# Patient Record
Sex: Female | Born: 1945
Health system: Southern US, Community
[De-identification: ages and names within clinical notes are randomized; demographics above are authoritative.]

## PROBLEM LIST (undated history)

## (undated) DIAGNOSIS — D649 Anemia, unspecified: Secondary | ICD-10-CM

## (undated) DIAGNOSIS — K2970 Gastritis, unspecified, without bleeding: Secondary | ICD-10-CM

## (undated) DIAGNOSIS — N879 Dysplasia of cervix uteri, unspecified: Secondary | ICD-10-CM

## (undated) DIAGNOSIS — K219 Gastro-esophageal reflux disease without esophagitis: Secondary | ICD-10-CM

## (undated) DIAGNOSIS — T7840XA Allergy, unspecified, initial encounter: Secondary | ICD-10-CM

## (undated) DIAGNOSIS — M8580 Other specified disorders of bone density and structure, unspecified site: Secondary | ICD-10-CM

## (undated) HISTORY — DX: Other specified disorders of bone density and structure, unspecified site: M85.80

## (undated) HISTORY — PX: OTHER SURGICAL HISTORY: SHX169

## (undated) HISTORY — PX: NASAL SINUS SURGERY: SHX719

## (undated) HISTORY — PX: GYNECOLOGIC CRYOSURGERY: SHX857

## (undated) HISTORY — DX: Anemia, unspecified: D64.9

## (undated) HISTORY — PX: EYE SURGERY: SHX253

## (undated) HISTORY — DX: Dysplasia of cervix uteri, unspecified: N87.9

## (undated) HISTORY — DX: Allergy, unspecified, initial encounter: T78.40XA

## (undated) HISTORY — DX: Gastro-esophageal reflux disease without esophagitis: K21.9

## (undated) HISTORY — PX: TONSILLECTOMY: SUR1361

## (undated) HISTORY — PX: COLPOSCOPY: SHX161

## (undated) HISTORY — DX: Gastritis, unspecified, without bleeding: K29.70

---

## 1968-12-17 DIAGNOSIS — N879 Dysplasia of cervix uteri, unspecified: Secondary | ICD-10-CM

## 1968-12-17 HISTORY — DX: Dysplasia of cervix uteri, unspecified: N87.9

## 1999-04-15 ENCOUNTER — Encounter: Admission: RE | Admit: 1999-04-15 | Discharge: 1999-04-15 | Payer: Self-pay | Admitting: Gynecology

## 1999-04-15 ENCOUNTER — Encounter: Payer: Self-pay | Admitting: Gynecology

## 1999-05-11 ENCOUNTER — Other Ambulatory Visit: Admission: RE | Admit: 1999-05-11 | Discharge: 1999-05-11 | Payer: Self-pay | Admitting: Gynecology

## 2000-04-17 ENCOUNTER — Encounter: Admission: RE | Admit: 2000-04-17 | Discharge: 2000-04-17 | Payer: Self-pay | Admitting: Gynecology

## 2000-04-17 ENCOUNTER — Encounter: Payer: Self-pay | Admitting: Gynecology

## 2000-04-27 ENCOUNTER — Other Ambulatory Visit: Admission: RE | Admit: 2000-04-27 | Discharge: 2000-04-27 | Payer: Self-pay | Admitting: Gynecology

## 2001-04-19 ENCOUNTER — Encounter: Admission: RE | Admit: 2001-04-19 | Discharge: 2001-04-19 | Payer: Self-pay | Admitting: Gynecology

## 2001-04-19 ENCOUNTER — Encounter: Payer: Self-pay | Admitting: Gynecology

## 2001-04-30 ENCOUNTER — Other Ambulatory Visit: Admission: RE | Admit: 2001-04-30 | Discharge: 2001-04-30 | Payer: Self-pay | Admitting: Gynecology

## 2003-02-21 ENCOUNTER — Encounter: Admission: RE | Admit: 2003-02-21 | Discharge: 2003-02-21 | Payer: Self-pay | Admitting: Obstetrics and Gynecology

## 2003-03-17 ENCOUNTER — Other Ambulatory Visit: Admission: RE | Admit: 2003-03-17 | Discharge: 2003-03-17 | Payer: Self-pay | Admitting: Obstetrics and Gynecology

## 2004-04-18 HISTORY — PX: UPPER GASTROINTESTINAL ENDOSCOPY: SHX188

## 2004-04-29 ENCOUNTER — Other Ambulatory Visit: Admission: RE | Admit: 2004-04-29 | Discharge: 2004-04-29 | Payer: Self-pay | Admitting: Obstetrics and Gynecology

## 2005-05-31 ENCOUNTER — Other Ambulatory Visit: Admission: RE | Admit: 2005-05-31 | Discharge: 2005-05-31 | Payer: Self-pay | Admitting: Obstetrics and Gynecology

## 2006-08-28 ENCOUNTER — Encounter: Admission: RE | Admit: 2006-08-28 | Discharge: 2006-08-28 | Payer: Self-pay | Admitting: Obstetrics and Gynecology

## 2006-09-14 ENCOUNTER — Other Ambulatory Visit: Admission: RE | Admit: 2006-09-14 | Discharge: 2006-09-14 | Payer: Self-pay | Admitting: Obstetrics and Gynecology

## 2007-09-05 ENCOUNTER — Encounter: Admission: RE | Admit: 2007-09-05 | Discharge: 2007-09-05 | Payer: Self-pay | Admitting: Obstetrics and Gynecology

## 2007-09-18 ENCOUNTER — Other Ambulatory Visit: Admission: RE | Admit: 2007-09-18 | Discharge: 2007-09-18 | Payer: Self-pay | Admitting: Obstetrics and Gynecology

## 2008-09-05 ENCOUNTER — Encounter: Admission: RE | Admit: 2008-09-05 | Discharge: 2008-09-05 | Payer: Self-pay | Admitting: Obstetrics and Gynecology

## 2008-09-09 ENCOUNTER — Encounter: Admission: RE | Admit: 2008-09-09 | Discharge: 2008-09-09 | Payer: Self-pay | Admitting: Obstetrics and Gynecology

## 2008-09-23 ENCOUNTER — Ambulatory Visit: Payer: Self-pay | Admitting: Obstetrics and Gynecology

## 2008-09-25 ENCOUNTER — Other Ambulatory Visit: Admission: RE | Admit: 2008-09-25 | Discharge: 2008-09-25 | Payer: Self-pay | Admitting: Obstetrics and Gynecology

## 2008-09-25 ENCOUNTER — Ambulatory Visit: Payer: Self-pay | Admitting: Obstetrics and Gynecology

## 2008-09-25 ENCOUNTER — Encounter: Payer: Self-pay | Admitting: Obstetrics and Gynecology

## 2009-05-21 ENCOUNTER — Ambulatory Visit: Payer: Self-pay | Admitting: Obstetrics and Gynecology

## 2009-09-17 ENCOUNTER — Encounter: Admission: RE | Admit: 2009-09-17 | Discharge: 2009-09-17 | Payer: Self-pay | Admitting: Obstetrics and Gynecology

## 2009-10-01 ENCOUNTER — Ambulatory Visit: Payer: Self-pay | Admitting: Obstetrics and Gynecology

## 2010-01-25 ENCOUNTER — Ambulatory Visit (HOSPITAL_COMMUNITY): Admission: RE | Admit: 2010-01-25 | Discharge: 2010-01-25 | Payer: Self-pay | Admitting: Family Medicine

## 2010-01-27 ENCOUNTER — Ambulatory Visit (HOSPITAL_COMMUNITY): Admission: RE | Admit: 2010-01-27 | Discharge: 2010-01-27 | Payer: Self-pay | Admitting: Obstetrics and Gynecology

## 2010-01-27 ENCOUNTER — Ambulatory Visit: Payer: Self-pay | Admitting: Obstetrics and Gynecology

## 2010-02-09 ENCOUNTER — Ambulatory Visit: Payer: Self-pay | Admitting: Obstetrics and Gynecology

## 2010-02-25 ENCOUNTER — Ambulatory Visit: Payer: Self-pay | Admitting: Obstetrics and Gynecology

## 2010-03-18 HISTORY — PX: DILATION AND CURETTAGE OF UTERUS: SHX78

## 2010-03-26 ENCOUNTER — Ambulatory Visit: Payer: Self-pay | Admitting: Obstetrics and Gynecology

## 2010-03-26 ENCOUNTER — Ambulatory Visit
Admission: RE | Admit: 2010-03-26 | Discharge: 2010-03-26 | Payer: Self-pay | Source: Home / Self Care | Attending: Obstetrics and Gynecology | Admitting: Obstetrics and Gynecology

## 2010-04-01 ENCOUNTER — Ambulatory Visit: Payer: Self-pay | Admitting: Obstetrics and Gynecology

## 2010-06-29 LAB — POCT HEMOGLOBIN-HEMACUE: Hemoglobin: 13.1 g/dL (ref 12.0–15.0)

## 2010-09-23 ENCOUNTER — Other Ambulatory Visit: Payer: Self-pay | Admitting: Obstetrics and Gynecology

## 2010-09-23 DIAGNOSIS — Z1231 Encounter for screening mammogram for malignant neoplasm of breast: Secondary | ICD-10-CM

## 2010-09-30 ENCOUNTER — Encounter (INDEPENDENT_AMBULATORY_CARE_PROVIDER_SITE_OTHER): Payer: Medicare Other

## 2010-09-30 ENCOUNTER — Ambulatory Visit
Admission: RE | Admit: 2010-09-30 | Discharge: 2010-09-30 | Disposition: A | Discharge status: Home / Self Care | Payer: Medicare Other | Source: Ambulatory Visit | Attending: Obstetrics and Gynecology | Admitting: Obstetrics and Gynecology

## 2010-09-30 DIAGNOSIS — M810 Age-related osteoporosis without current pathological fracture: Secondary | ICD-10-CM

## 2010-09-30 DIAGNOSIS — Z1231 Encounter for screening mammogram for malignant neoplasm of breast: Secondary | ICD-10-CM

## 2010-10-07 ENCOUNTER — Ambulatory Visit: Payer: Self-pay

## 2010-10-19 ENCOUNTER — Ambulatory Visit
Admission: RE | Admit: 2010-10-19 | Discharge: 2010-10-19 | Disposition: A | Discharge status: Home / Self Care | Payer: Medicare Other | Source: Ambulatory Visit | Attending: Family Medicine | Admitting: Family Medicine

## 2010-10-19 ENCOUNTER — Other Ambulatory Visit: Payer: Self-pay | Admitting: Family Medicine

## 2010-10-19 DIAGNOSIS — R197 Diarrhea, unspecified: Secondary | ICD-10-CM

## 2010-10-19 DIAGNOSIS — K921 Melena: Secondary | ICD-10-CM

## 2010-10-19 MED ORDER — IOHEXOL 300 MG/ML  SOLN
100.0000 mL | Freq: Once | INTRAMUSCULAR | Status: AC | PRN
  Administered 2010-10-19: 100 mL via INTRAVENOUS

## 2010-10-21 ENCOUNTER — Other Ambulatory Visit (HOSPITAL_COMMUNITY)
Admission: RE | Admit: 2010-10-21 | Discharge: 2010-10-21 | Disposition: A | Discharge status: Home / Self Care | Payer: Medicare Other | Source: Ambulatory Visit | Attending: Obstetrics and Gynecology | Admitting: Obstetrics and Gynecology

## 2010-10-21 ENCOUNTER — Other Ambulatory Visit: Payer: Self-pay | Admitting: Obstetrics and Gynecology

## 2010-10-21 ENCOUNTER — Encounter (INDEPENDENT_AMBULATORY_CARE_PROVIDER_SITE_OTHER): Payer: Medicare Other | Admitting: Obstetrics and Gynecology

## 2010-10-21 DIAGNOSIS — Z124 Encounter for screening for malignant neoplasm of cervix: Secondary | ICD-10-CM | POA: Insufficient documentation

## 2010-10-21 DIAGNOSIS — K625 Hemorrhage of anus and rectum: Secondary | ICD-10-CM

## 2010-10-21 DIAGNOSIS — R82998 Other abnormal findings in urine: Secondary | ICD-10-CM

## 2010-10-21 DIAGNOSIS — N951 Menopausal and female climacteric states: Secondary | ICD-10-CM

## 2010-10-21 DIAGNOSIS — M949 Disorder of cartilage, unspecified: Secondary | ICD-10-CM

## 2010-10-21 DIAGNOSIS — N952 Postmenopausal atrophic vaginitis: Secondary | ICD-10-CM

## 2010-11-17 ENCOUNTER — Other Ambulatory Visit: Payer: Self-pay | Admitting: *Deleted

## 2010-11-17 MED ORDER — NONFORMULARY OR COMPOUNDED ITEM: Status: DC

## 2011-01-05 ENCOUNTER — Encounter: Payer: Self-pay | Admitting: Internal Medicine

## 2011-02-03 ENCOUNTER — Encounter: Payer: Self-pay | Admitting: Internal Medicine

## 2011-02-08 ENCOUNTER — Ambulatory Visit (INDEPENDENT_AMBULATORY_CARE_PROVIDER_SITE_OTHER): Payer: Medicare Other | Admitting: Internal Medicine

## 2011-02-08 ENCOUNTER — Encounter: Payer: Self-pay | Admitting: Internal Medicine

## 2011-02-08 ENCOUNTER — Other Ambulatory Visit (INDEPENDENT_AMBULATORY_CARE_PROVIDER_SITE_OTHER): Payer: Medicare Other

## 2011-02-08 VITALS — BP 118/74 | HR 64 | Ht 65.0 in | Wt 147.0 lb

## 2011-02-08 DIAGNOSIS — K625 Hemorrhage of anus and rectum: Secondary | ICD-10-CM

## 2011-02-08 DIAGNOSIS — R143 Flatulence: Secondary | ICD-10-CM

## 2011-02-08 DIAGNOSIS — M8580 Other specified disorders of bone density and structure, unspecified site: Secondary | ICD-10-CM | POA: Insufficient documentation

## 2011-02-08 DIAGNOSIS — M899 Disorder of bone, unspecified: Secondary | ICD-10-CM

## 2011-02-08 DIAGNOSIS — R140 Abdominal distension (gaseous): Secondary | ICD-10-CM

## 2011-02-08 DIAGNOSIS — R142 Eructation: Secondary | ICD-10-CM

## 2011-02-08 MED ORDER — RANITIDINE HCL 300 MG PO TABS: 300.0000 mg | ORAL_TABLET | Freq: Every day | ORAL | Status: DC

## 2011-02-08 MED ORDER — PEG-KCL-NACL-NASULF-NA ASC-C 100 G PO SOLR: 1.0000 | Freq: Once | ORAL | Status: DC

## 2011-02-08 MED ORDER — ALIGN PO CAPS: 1.0000 | ORAL_CAPSULE | Freq: Every day | ORAL | Status: AC

## 2011-02-08 NOTE — Progress Notes (Signed)
Subjective:    Patient ID: Natalie Browning, female    DOB: 20-Feb-1946, 65 y.o.   MRN: [id number]  HPI Natalie Browning is a 65 yo female with little PMH who is seen in consultation at the request of Dr. Tanya Nones for evaluation of bloating and epigastric pain associated with eating.  The patient reports today she feels very well and is without specific complaints. She reports when she last saw her PCP she was having intermittent epigastric discomfort but this has completely resolved. She does report intermittent bloating and belching. This seems to be worse with foods such as bread. She also reports belching which seems to be worse at night. She reports 3-4 times per week waking up with a feeling of a "bubble" in her stomach which is relieved by belching. She often will chew TUMS at this point in an attempt to avoid the need for further belching. She denies specific pain associated with this sensation. She also denies nausea and vomiting. No dysphagia or odynophagia. Her appetite is good and her weight is stable without weight loss. She's had no fevers or chills. She was given a prescription for omeprazole but discontinued this after developing a rash. She is also concerned about calcium absorption with PPI therapy given her history of osteopenia. She denies heartburn as a major complaint, though she will very infrequently have heartburn. She reports watching her diet, specifically eating late at night. The dietary modifications control her symptoms. At present she reports her stools are normal, occurring once per day. Her stools have been formed, without bright red blood or melena.  She does report an isolated episode of bright red blood per rectum which occurred in July 2012. She subsequently has not seen blood in her stools. She reports an episode in early July a feeling nauseous and weak. This was followed by the feeling of being "paralyzed".  At that time she had associated shortness of breath. She reports this  slowly resolved over course of a few hours, but the next day is when she noticed the bright red rectal bleeding. There've been no further neurologic symptoms or nausea since this event.  Of note she feels she had a colonoscopy and upper endoscopy about 6 years ago. She is unsure who did this procedure, but feels it might have been Dr. Kinnie Scales. She does not recall any specific findings/problems found during these exams.   Review of Systems Constitutional: Negative for fever, chills, night sweats, activity change, appetite change and unexpected weight change HEENT: Negative for sore throat, mouth sores and trouble swallowing. Eyes: Negative for visual disturbance Respiratory: Negative for cough, chest tightness and shortness of breath Cardiovascular: Negative for chest pain, palpitations and lower extremity swelling Gastrointestinal: See history of present illness Genitourinary: Negative for dysuria and hematuria. Musculoskeletal: Negative for back pain, arthralgias and myalgias Skin: Negative for rash or color change Neurological: Negative for headaches, weakness, numbness Hematological: Negative for adenopathy, negative for easy bruising/bleeding Psychiatric/behavioral: Negative for depressed mood, negative for anxiety  PMH: 1. Osteopenia 2. Uterine polyps - s/p D&C 3. Tonsillectomy  Current Outpatient Prescriptions  Medication Sig Dispense Refill  . NONFORMULARY/COMPOUNDED ITEM Triest 1.25 mg gel daily        Allergies  Allergen Reactions  . Codeine   . Omeprazole    Family History  Problem Relation Age of Onset  . Diabetes Mother   . Heart disease Mother   . Heart disease Father   --Neg for GI tract malignancy  Social History  .  Marital Status: Married   Social History Main Topics  . Smoking status: Never Smoker   . Smokeless tobacco: Never Used  . Alcohol Use: No  . 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 No distress. HEENT: Normocephalic and atraumatic. Oropharynx is clear and moist. No oropharyngeal exudate. Conjunctivae are normal. Pupils are equal round and reactive to light. No scleral icterus. Neck: Neck supple. Trachea midline. Cardiovascular: Normal rate, regular rhythm and intact distal pulses. No M/R/G Pulmonary/chest: Effort normal and breath sounds normal. No wheezing, rales or rhonchi. Abdominal: Soft, nontender, nondistended. Bowel sounds active throughout. There are no masses palpable. No hepatosplenomegaly. Extremities: no clubbing, cyanosis, or edema Lymphadenopathy: No cervical adenopathy noted. Neurological: Alert and oriented to person place and time. Skin: Skin is warm and dry. No rashes noted. Psychiatric: Normal mood and affect. Behavior is normal.  Labs 12/29/2010: Sodium 138 potassium 4.7 chloride 101 CO2 24 BUN 15 creatinine 0.89 glucose 101 Total bili 1.0 alkaline phosphatase 66 AST 22 ALT 11 Total protein 7.0 albumin 4.1 calcium 9.5 H. pylori antibody, IgG negative WBC 7.4 hemoglobin 13.0 hematocrit 40.1 platelet 231  Imaging 10/19/2010 CT abdomen and pelvis with contrast There are 2 tiny hepatic cysts, facet degenerative changes are present at the lower lumbar spine. Otherwise unremarkable.    Assessment & Plan:  65 yo female with PMH of osteopenia who presents with intermittent bloating/belching with an episode of hematochezia in July 2012.  1. Bloating/belching -- the patient is reporting intermittent bloating and belching. She is not currently having epigastric or abdominal pain. At present there are no alarm symptoms, including no bleeding, nausea, vomiting, weight loss, early satiety. This is reassuring. At present I do not think upper endoscopy is warranted, but we will try H2 blocker therapy  each bedtime. Hopefully this will help with some of her nocturnal symptoms, and given that she will only take this at bedtime it should not interfere significantly with calcium absorption. I also will prescribe Align one tablet daily, which also may help with bloating/belching and improve overall digestion. I will check a celiac panel today. The remainder her her labs/imaging have been reviewed and are reassuring. If her symptoms continue, don't respond to therapy, or worsen then we will proceed with upper endoscopy. I will attempt to get records of her previous endoscopy.   2. Isolated rectal bleeding -- the patient did have an episode of hematochezia in July 2012. This has not recurred, and certainly could have been hemorrhoidal in nature. However given the uncertainty here, we will proceed with colonoscopy. She is agreeable to this test he'll be scheduled for her today.  Return in 3 months or sooner if needed.

## 2011-02-08 NOTE — Patient Instructions (Addendum)
Please go downstairs after your appointment to have labs drawn. You have been scheduled for a colonoscopy, instructions provided. The prescription for the prep has been sent to your pharmacy. You may take Ranitidine, 300mg m by mouth every night You may take Align, 1 tablet every day.  Please follow up in 3 months

## 2011-02-10 ENCOUNTER — Telehealth: Payer: Self-pay | Admitting: *Deleted

## 2011-02-10 NOTE — Telephone Encounter (Signed)
Informed pt her Celiac Panel was negative and we will see her on 03/01/11 at 3:30pm for her procedure; pt stated understanding.

## 2011-02-10 NOTE — Telephone Encounter (Signed)
Message copied by Florene Glen on Thu Feb 10, 2011 10:24 AM ------      Message from: Beverley Fiedler      Created: Thu Feb 10, 2011  8:31 AM       Celiac panel negative.

## 2011-03-01 ENCOUNTER — Encounter: Payer: Self-pay | Admitting: Internal Medicine

## 2011-03-01 ENCOUNTER — Ambulatory Visit (AMBULATORY_SURGERY_CENTER): Payer: Medicare Other | Admitting: Internal Medicine

## 2011-03-01 VITALS — BP 115/78 | HR 57 | Temp 98.3°F | Resp 15 | Ht 65.0 in | Wt 147.0 lb

## 2011-03-01 DIAGNOSIS — K625 Hemorrhage of anus and rectum: Secondary | ICD-10-CM

## 2011-03-01 DIAGNOSIS — Z1211 Encounter for screening for malignant neoplasm of colon: Secondary | ICD-10-CM

## 2011-03-01 MED ORDER — SODIUM CHLORIDE 0.9 % IV SOLN: 500.0000 mL | INTRAVENOUS | Status: DC

## 2011-03-01 NOTE — Patient Instructions (Signed)
Mild diverticulosis (pouches not diverticulitis). Internal hemorrhoids otherwise normal. High fiber diet but not today.  See blue and green discharge instruction sheets.

## 2011-03-02 ENCOUNTER — Telehealth: Payer: Self-pay | Admitting: *Deleted

## 2011-03-02 NOTE — Telephone Encounter (Signed)

## 2011-09-14 ENCOUNTER — Other Ambulatory Visit: Payer: Self-pay | Admitting: Obstetrics and Gynecology

## 2011-09-14 DIAGNOSIS — Z1231 Encounter for screening mammogram for malignant neoplasm of breast: Secondary | ICD-10-CM

## 2011-10-05 ENCOUNTER — Ambulatory Visit
Admission: RE | Admit: 2011-10-05 | Discharge: 2011-10-05 | Disposition: A | Discharge status: Home / Self Care | Payer: Medicare Other | Source: Ambulatory Visit | Attending: Obstetrics and Gynecology | Admitting: Obstetrics and Gynecology

## 2011-10-05 DIAGNOSIS — Z1231 Encounter for screening mammogram for malignant neoplasm of breast: Secondary | ICD-10-CM

## 2011-10-13 ENCOUNTER — Encounter: Payer: Self-pay | Admitting: Gynecology

## 2011-10-24 ENCOUNTER — Encounter: Payer: Self-pay | Admitting: Obstetrics and Gynecology

## 2011-10-24 ENCOUNTER — Ambulatory Visit (INDEPENDENT_AMBULATORY_CARE_PROVIDER_SITE_OTHER): Payer: Medicare Other | Admitting: Obstetrics and Gynecology

## 2011-10-24 VITALS — BP 120/78 | Ht 65.0 in | Wt 153.0 lb

## 2011-10-24 DIAGNOSIS — Z780 Asymptomatic menopausal state: Secondary | ICD-10-CM

## 2011-10-24 DIAGNOSIS — M8580 Other specified disorders of bone density and structure, unspecified site: Secondary | ICD-10-CM

## 2011-10-24 DIAGNOSIS — M949 Disorder of cartilage, unspecified: Secondary | ICD-10-CM | POA: Diagnosis not present

## 2011-10-24 DIAGNOSIS — M899 Disorder of bone, unspecified: Secondary | ICD-10-CM | POA: Diagnosis not present

## 2011-10-24 DIAGNOSIS — D069 Carcinoma in situ of cervix, unspecified: Secondary | ICD-10-CM | POA: Diagnosis not present

## 2011-10-24 DIAGNOSIS — H269 Unspecified cataract: Secondary | ICD-10-CM | POA: Insufficient documentation

## 2011-10-24 NOTE — Progress Notes (Signed)
The patient came to see me today for further followup. She has been on hormone replacement therapy for greater than 10 years. She had preferred bioidentical hormones and has done well on them. She had terrible sleep disturbance due to hot flashes and they have worked well. We switched her a year ago from oral triest to cream to reduce thromboembolic risk and she is just as happy. She has no withdrawal bleeding to cyclical progesterone. She has had no vaginal bleeding at all. She is having no pelvic pain. She is up-to-date on mammograms. She is osteopenia on bone density without an elevated FRAX risk. Her last bone density was 2012 and showed improvement. She is also not having atrophic vaginitis symptoms with the estrogen. She does get some joint pain in the morning but it is minimal. She has a history of CIN treated with cryosurgery back in the 1970s. She has had normal Paps since then and had a normal Pap last year.  ROS: 12 system review done. Pertinent positives above. Only other positive is cataracts.  Physical examination: Kennon Portela present. HEENT within normal limits. Neck: Thyroid not large. No masses. Supraclavicular nodes: not enlarged. Breasts: Examined in both sitting and lying  position. No skin changes and no masses. Abdomen: Soft no guarding rebound or masses or hernia. Pelvic: External: Within normal limits. BUS: Within normal limits. Vaginal:within normal limits. Good estrogen effect. No evidence of cystocele rectocele or enterocele. Cervix: clean. Uterus: Normal size and shape. Adnexa: No masses. Rectovaginal exam: Confirmatory and negative. Extremities: Within normal limits.  Assessment: #1. Menopausal symptoms #2. CIN #3. Stable osteopenia  Plan: We discussed HRT. Discussed a higher risk of breast cancer. Discussed problems related to bioidentical hormones. Patient would like to continue hormones and use  bioidentical in spite of the above. Continue Triest cream 1.25mg  daily.  Continue Prometrium 200 mg for 12 days a month. Patient may take a break in the fall to see if she needs them. Continue yearly mammograms. Continue periodic bone densities.The new Pap smear guidelines were discussed with the patient. No pap done.

## 2011-10-28 ENCOUNTER — Encounter: Payer: Self-pay | Admitting: Obstetrics and Gynecology

## 2011-11-07 DIAGNOSIS — J019 Acute sinusitis, unspecified: Secondary | ICD-10-CM | POA: Diagnosis not present

## 2011-11-07 DIAGNOSIS — J45909 Unspecified asthma, uncomplicated: Secondary | ICD-10-CM | POA: Diagnosis not present

## 2011-11-18 DIAGNOSIS — R05 Cough: Secondary | ICD-10-CM | POA: Diagnosis not present

## 2011-11-18 DIAGNOSIS — J301 Allergic rhinitis due to pollen: Secondary | ICD-10-CM | POA: Diagnosis not present

## 2012-02-08 DIAGNOSIS — Z23 Encounter for immunization: Secondary | ICD-10-CM | POA: Diagnosis not present

## 2012-02-27 DIAGNOSIS — H2510 Age-related nuclear cataract, unspecified eye: Secondary | ICD-10-CM | POA: Diagnosis not present

## 2012-02-27 DIAGNOSIS — IMO0002 Reserved for concepts with insufficient information to code with codable children: Secondary | ICD-10-CM | POA: Diagnosis not present

## 2012-03-07 DIAGNOSIS — H2510 Age-related nuclear cataract, unspecified eye: Secondary | ICD-10-CM | POA: Diagnosis not present

## 2012-03-28 DIAGNOSIS — H2510 Age-related nuclear cataract, unspecified eye: Secondary | ICD-10-CM | POA: Diagnosis not present

## 2012-04-04 DIAGNOSIS — H2510 Age-related nuclear cataract, unspecified eye: Secondary | ICD-10-CM | POA: Diagnosis not present

## 2012-08-09 DIAGNOSIS — Z961 Presence of intraocular lens: Secondary | ICD-10-CM | POA: Diagnosis not present

## 2012-09-05 ENCOUNTER — Other Ambulatory Visit: Payer: Self-pay

## 2012-09-05 DIAGNOSIS — Z1231 Encounter for screening mammogram for malignant neoplasm of breast: Secondary | ICD-10-CM

## 2012-10-15 ENCOUNTER — Ambulatory Visit
Admission: RE | Admit: 2012-10-15 | Discharge: 2012-10-15 | Disposition: A | Discharge status: Home / Self Care | Payer: Medicare Other | Source: Ambulatory Visit

## 2012-10-15 DIAGNOSIS — Z1231 Encounter for screening mammogram for malignant neoplasm of breast: Secondary | ICD-10-CM

## 2012-10-29 ENCOUNTER — Encounter: Payer: Self-pay | Admitting: Gynecology

## 2012-10-29 ENCOUNTER — Ambulatory Visit (INDEPENDENT_AMBULATORY_CARE_PROVIDER_SITE_OTHER): Payer: Medicare Other | Admitting: Gynecology

## 2012-10-29 VITALS — BP 120/70 | Ht 65.0 in | Wt 152.0 lb

## 2012-10-29 DIAGNOSIS — N952 Postmenopausal atrophic vaginitis: Secondary | ICD-10-CM

## 2012-10-29 DIAGNOSIS — Z79890 Hormone replacement therapy (postmenopausal): Secondary | ICD-10-CM

## 2012-10-29 DIAGNOSIS — M899 Disorder of bone, unspecified: Secondary | ICD-10-CM | POA: Diagnosis not present

## 2012-10-29 DIAGNOSIS — N951 Menopausal and female climacteric states: Secondary | ICD-10-CM

## 2012-10-29 DIAGNOSIS — M8580 Other specified disorders of bone density and structure, unspecified site: Secondary | ICD-10-CM

## 2012-10-29 DIAGNOSIS — R82998 Other abnormal findings in urine: Secondary | ICD-10-CM | POA: Diagnosis not present

## 2012-10-29 MED ORDER — NONFORMULARY OR COMPOUNDED ITEM: Status: DC

## 2012-10-29 MED ORDER — PROGESTERONE MICRONIZED 200 MG PO CAPS: 200.0000 mg | ORAL_CAPSULE | ORAL | Status: DC

## 2012-10-29 NOTE — Patient Instructions (Signed)
Followup in one year, sooner as needed.  Report any vaginal bleeding. 

## 2012-10-29 NOTE — Progress Notes (Signed)
Natalie Browning [birth year] [id number]        67 y.o.  G0P0 for followup exam.  Former patient of Dr. Eda Paschal.  Past medical history,surgical history, medications, allergies, family history and social history were all reviewed and documented in the EPIC chart.  ROS:  Performed and pertinent positives and negatives are included in the history, assessment and plan .  Exam: Kim assistant Filed Vitals:   10/29/12 1016  BP: 120/70  Height: 5\' 5"  (1.651 m)  Weight: 152 lb (68.947 kg)   General appearance  Normal Skin grossly normal Head/Neck normal with no cervical or supraclavicular adenopathy thyroid normal Lungs  clear Cardiac RR, without RMG Abdominal  soft, nontender, without masses, organomegaly or hernia Breasts  examined lying and sitting without masses, retractions, discharge or axillary adenopathy. Pelvic  Ext/BUS/vagina  normal with atrophic changes  Cervix  normal with atrophic changes  Uterus  anteverted, normal size, shape and contour, midline and mobile nontender   Adnexa  Without masses or tenderness    Anus and perineum  normal   Rectovaginal  normal sphincter tone without palpated masses or tenderness.    Assessment/Plan:  67 y.o. G0P0 female for annual exam.   1. HRT. Patient is on Triest 1.25 mg (formulated estrogen cream) daily and Prometrium 200 mg first 12 days of each month with no withdrawal bleeding.  I reviewed the whole issue of HRT with her to include the WHI study with increased risk of stroke, heart attack, DVT and breast cancer. The ACOG and NAMS statements for lowest dose for the shortest period of time reviewed. Transdermal versus oral first-pass effect benefit discussed as well as bioidentical versus pharmaceutical grade issues and lack of prospective randomized studies as Dr. Eda Paschal has in the past. Patient has tried weaning with unacceptable hot flushes and night sweats. Currently not sexually active and not having vaginal symptoms. After likely  discussion she wants to continue and I refill her times a year. Alternatives to include switching to daily progesterone such as Prometrium 100 mg nightly discussed but patient would prefer to keep doing what she's doing. She knows to report any vaginal bleeding to me. 2. Osteopenia. Excess 09/2010 T score -2.1 FRAX 17%/1.9%. Stable from prior DEXA 2 years before. Options to repeat DEXA now versus waiting another year discussed. Patient would prefer to wait another year. Increase calcium vitamin D reviewed. 3. Pap smear 2012. No Pap smear done today. History of cryosurgery in the 1970s with normal Pap smears since then. Options to stop screening altogether she is over the age of 65 or less frequent screening intervals reviewed. Will rediscuss next year at a 3 year interval. 4. Mammography 09/2012. Continue with annual mammography. SBE monthly reviewed. 5. Colonoscopy 2012. Repeated their recommended interval. 6. Health maintenance. No blood work done. Recommended she followup with her primary for general health exam and blood work and she agrees to arrange.  Note: This document was prepared with digital dictation and possible smart phrase technology. Any transcriptional errors that result from this process are unintentional.   Dara Lords MD, 10:47 AM 10/29/2012

## 2012-10-30 LAB — URINALYSIS W MICROSCOPIC + REFLEX CULTURE
Bacteria, UA: NONE SEEN
Bilirubin Urine: NEGATIVE
Casts: NONE SEEN
Crystals: NONE SEEN
Glucose, UA: NEGATIVE mg/dL
Hgb urine dipstick: NEGATIVE
Ketones, ur: NEGATIVE mg/dL
Specific Gravity, Urine: 1.017 (ref 1.005–1.030)
pH: 7 (ref 5.0–8.0)

## 2012-11-01 ENCOUNTER — Other Ambulatory Visit: Payer: Self-pay | Admitting: Gynecology

## 2012-11-01 MED ORDER — NITROFURANTOIN MONOHYD MACRO 100 MG PO CAPS: 100.0000 mg | ORAL_CAPSULE | Freq: Two times a day (BID) | ORAL | Status: DC

## 2012-11-02 ENCOUNTER — Telehealth: Payer: Self-pay | Admitting: *Deleted

## 2012-11-02 MED ORDER — SULFAMETHOXAZOLE-TMP DS 800-160 MG PO TABS: 1.0000 | ORAL_TABLET | Freq: Two times a day (BID) | ORAL | Status: DC

## 2012-11-02 NOTE — Telephone Encounter (Signed)
We can try Septra DS 1 by mouth twice a day x3 days

## 2012-11-02 NOTE — Telephone Encounter (Signed)
Pt informed with the below note. rx sent 

## 2012-11-02 NOTE — Telephone Encounter (Signed)
Left message on voicemail.

## 2012-11-02 NOTE — Telephone Encounter (Signed)
Pt was given rx for Macrobid 100 mg x 7 days  Yesterday pt said she took medication c/o nausea, feels very sick. Please advise

## 2012-11-15 ENCOUNTER — Telehealth: Payer: Self-pay | Admitting: Family Medicine

## 2012-11-15 NOTE — Telephone Encounter (Signed)
Pt claims that [facility] had prescribe omeprazole to her in February 2013 I see no information where we had prescribed to her. Pt states her last ov was in feb 2013 as well.

## 2012-11-15 NOTE — Telephone Encounter (Signed)
If LOV was 05/2011, then she NTBS

## 2012-11-16 ENCOUNTER — Other Ambulatory Visit: Payer: Self-pay | Admitting: Physician Assistant

## 2012-11-16 MED ORDER — OMEPRAZOLE 20 MG PO CPDR: 20.0000 mg | DELAYED_RELEASE_CAPSULE | Freq: Every day | ORAL | Status: DC

## 2012-11-16 NOTE — Telephone Encounter (Signed)
Pt has not been seen in over a year.  Must make appt.

## 2012-11-16 NOTE — Telephone Encounter (Signed)
Per Dr. Tanya Nones ok to refill pt was seen here in 11/18/11 she does not need an appt. for this medication. Med refilled and .Patient aware

## 2012-11-20 ENCOUNTER — Ambulatory Visit: Payer: Medicare Other | Admitting: Family Medicine

## 2012-12-06 ENCOUNTER — Telehealth: Payer: Self-pay | Admitting: Family Medicine

## 2012-12-06 NOTE — Telephone Encounter (Signed)
Pt is taking Omeprazole 20 mg at lunch time. She said that it is not working. She is losing sleep because she is burping all night long. She said she was taking it a dinner time, but a friend told her to try taking it early so that is why she started taking it at lunch time. Is there anything else she can try?

## 2012-12-06 NOTE — Telephone Encounter (Signed)
Switch to pantoprazole 40 poday, elevate the head of bed 3- inchs with bricks under headboard.  NTBS if no better in 2 weeks.

## 2012-12-07 MED ORDER — PANTOPRAZOLE SODIUM 40 MG PO TBEC: 40.0000 mg | DELAYED_RELEASE_TABLET | Freq: Every day | ORAL | Status: DC

## 2012-12-07 NOTE — Addendum Note (Signed)
Addended by: Legrand Rams B on: 12/07/2012 02:50 PM   Modules accepted: Orders

## 2012-12-07 NOTE — Telephone Encounter (Signed)
LMTRC

## 2012-12-07 NOTE — Telephone Encounter (Signed)
Patient aware and med sent to pharmacy.  

## 2012-12-10 ENCOUNTER — Encounter: Payer: Self-pay | Admitting: Family Medicine

## 2012-12-10 ENCOUNTER — Ambulatory Visit (INDEPENDENT_AMBULATORY_CARE_PROVIDER_SITE_OTHER): Payer: Medicare Other | Admitting: Family Medicine

## 2012-12-10 VITALS — BP 116/70 | HR 52 | Temp 97.5°F | Resp 18 | Ht 65.0 in | Wt 152.0 lb

## 2012-12-10 DIAGNOSIS — G8929 Other chronic pain: Secondary | ICD-10-CM

## 2012-12-10 DIAGNOSIS — K3189 Other diseases of stomach and duodenum: Secondary | ICD-10-CM | POA: Diagnosis not present

## 2012-12-10 DIAGNOSIS — R1013 Epigastric pain: Secondary | ICD-10-CM

## 2012-12-10 LAB — CBC WITH DIFFERENTIAL/PLATELET
Eosinophils Absolute: 0.1 10*3/uL (ref 0.0–0.7)
Eosinophils Relative: 2 % (ref 0–5)
Hemoglobin: 13.6 g/dL (ref 12.0–15.0)
Lymphocytes Relative: 37 % (ref 12–46)
Lymphs Abs: 1.8 10*3/uL (ref 0.7–4.0)
MCH: 30 pg (ref 26.0–34.0)
MCV: 87.7 fL (ref 78.0–100.0)
Monocytes Relative: 6 % (ref 3–12)
Platelets: 237 10*3/uL (ref 150–400)
RBC: 4.54 MIL/uL (ref 3.87–5.11)
WBC: 4.8 10*3/uL (ref 4.0–10.5)

## 2012-12-10 LAB — COMPLETE METABOLIC PANEL WITH GFR
ALT: 18 U/L (ref 0–35)
BUN: 15 mg/dL (ref 6–23)
Calcium: 9.8 mg/dL (ref 8.4–10.5)
Creat: 0.98 mg/dL (ref 0.50–1.10)
GFR, Est African American: 68 mL/min
GFR, Est Non African American: 59 mL/min — ABNORMAL LOW
Potassium: 5.1 mEq/L (ref 3.5–5.3)
Sodium: 137 mEq/L (ref 135–145)

## 2012-12-10 NOTE — Progress Notes (Signed)
Subjective:    Patient ID: Natalie Browning, female    DOB: Jul 13, 1945, 67 y.o.   MRN: [id number]  HPI In July, the patient saw her gynecologist, and was diagnosed with a urinary tract infection. She was treated with Macrobid and then later a sulfa drug. She thereafter she has developed dyspepsia. For the entire month of August, the patient reports excessive belching. This occurs primarily at night. She's tried elevating the head of her bed as well as elevating herself on pillows without benefit. She burps constantly. He does improve with Beano.  She started omeprazole 40 mg by mouth daily but saw no benefit after 3 weeks. She denies any melena or hematochezia. She denies any excessive reflux during the day. She denies any signs or symptoms of bowel obstruction. She denies any fevers chills or weight loss. She denies abd pain. Past Medical History  Diagnosis Date  . Allergy   . Anemia   . Cataract   . Endometrial polyp   . Osteopenia 09/2010    T score -2.1 FRAX 17%/1.9%  . Cervical dysplasia 1970's   Past Surgical History  Procedure Laterality Date  . Tonsillectomy    . Dilation and curettage of uterus  03/18/2010  . Upper gastrointestinal endoscopy  2006  . Hysteroscopy    . Nasal sinus surgery    . Colposcopy    . Gynecologic cryosurgery    . Cataract surg     Current Outpatient Prescriptions on File Prior to Visit  Medication Sig Dispense Refill  . Calcium Carbonate-Vitamin D (CALCIUM + D PO) Take by mouth.      Marland Kitchen MAGNESIUM PO Take by mouth.      . Multiple Vitamin (MULTIVITAMIN) tablet Take 1 tablet by mouth daily.      . NONFORMULARY OR COMPOUNDED ITEM Triest 1.25 mg gel daily  1 each  12  . Omega-3 Fatty Acids (FISH OIL PO) Take by mouth.      . pantoprazole (PROTONIX) 40 MG tablet Take 1 tablet (40 mg total) by mouth daily.  30 tablet  11  . progesterone (PROMETRIUM) 200 MG capsule Take 1 capsule (200 mg total) by mouth as directed. Days 1-12  12 capsule  12  .  sulfamethoxazole-trimethoprim (BACTRIM DS) 800-160 MG per tablet Take 1 tablet by mouth 2 (two) times daily.  6 tablet  0   No current facility-administered medications on file prior to visit.   Allergies  Allergen Reactions  . Ciprofloxacin   . Codeine Itching and Nausea Only  . Macrobid [Nitrofurantoin] Nausea And Vomiting  . Simethicone Nausea Only   History   Social History  . Marital Status: Married    Spouse Name: N/A    Number of Children: N/A  . Years of Education: N/A   Occupational History  . Not on file.   Social History Main Topics  . Smoking status: Never Smoker   . Smokeless tobacco: Never Used  . Alcohol Use: No  . Drug Use: No  . Sexual Activity: No   Other Topics Concern  . Not on file   Social History Narrative  . No narrative on file     Review of Systems  All other systems reviewed and are negative.       Objective:   Physical Exam  Vitals reviewed. Constitutional: She appears well-developed and well-nourished. No distress.  Cardiovascular: Normal rate, regular rhythm, normal heart sounds and intact distal pulses.  Exam reveals no gallop and no friction rub.  No murmur heard. Pulmonary/Chest: Effort normal and breath sounds normal. No respiratory distress. She has no wheezes. She has no rales.  Abdominal: Soft. Bowel sounds are normal. She exhibits no distension. There is no tenderness. There is no rebound and no guarding.  Skin: She is not diaphoretic.          Assessment & Plan:  1. Abdominal pain, chronic, epigastric - CBC with Differential - COMPLETE METABOLIC PANEL WITH GFR - Helicobacter pylori abs-IgG+IgA, bld  2. Dyspepsia Discontinue omeprazole. Start pantoprazole 40 mg by mouth daily. Also recommended a probiotic one pill by mouth daily to see if this could be a type of bacterial imbalance triggered by the antibiotics as cause of excessive bloating and gas. Recheck in 2 weeks. Also check a CBC CMP and H. pylori studies.  If the labs are normal and the meds prove ineffective, consider an evaluation for a hiatal hernia.

## 2012-12-12 LAB — HELICOBACTER PYLORI ABS-IGG+IGA, BLD: HELICOBACTER PYLORI AB, IGA: 3.7 U/mL (ref <9.0)

## 2012-12-24 ENCOUNTER — Telehealth: Payer: Self-pay | Admitting: Family Medicine

## 2012-12-24 NOTE — Telephone Encounter (Signed)
Calling you back after two weeks of taking Protonix and Probiotic. Patients states is feeling much better.  Having less belching.  Wanted to let you know as instructed.

## 2012-12-25 ENCOUNTER — Telehealth: Payer: Self-pay | Admitting: Family Medicine

## 2012-12-25 NOTE — Telephone Encounter (Signed)
Pt returned call.  Stated is starting to have some diarrhea.  Told her this is probably due to Probiotic.  Dr Tanya Nones was going to tell her to stop that.  Continue Protonix.  Let us know if diarrhea does not resolve.

## 2012-12-25 NOTE — Telephone Encounter (Signed)
Natalie Browning wanted you to know that the anti-acid medication that you put her on has helped a lot with her burping.

## 2012-12-25 NOTE — Telephone Encounter (Signed)
Please continue protonix but stop probiotic and see if symptoms return.

## 2012-12-25 NOTE — Telephone Encounter (Signed)
Continue protonix and stop probiotic. I may have already sent you this.

## 2013-02-08 ENCOUNTER — Ambulatory Visit (INDEPENDENT_AMBULATORY_CARE_PROVIDER_SITE_OTHER): Payer: Medicare Other | Admitting: Family Medicine

## 2013-02-08 DIAGNOSIS — Z23 Encounter for immunization: Secondary | ICD-10-CM

## 2013-03-27 ENCOUNTER — Encounter: Payer: Self-pay | Admitting: Physician Assistant

## 2013-03-27 ENCOUNTER — Ambulatory Visit (INDEPENDENT_AMBULATORY_CARE_PROVIDER_SITE_OTHER): Payer: Medicare Other | Admitting: Physician Assistant

## 2013-03-27 VITALS — BP 128/88 | HR 72 | Temp 98.2°F | Resp 20 | Wt 154.0 lb

## 2013-03-27 DIAGNOSIS — A499 Bacterial infection, unspecified: Secondary | ICD-10-CM | POA: Diagnosis not present

## 2013-03-27 DIAGNOSIS — J988 Other specified respiratory disorders: Secondary | ICD-10-CM | POA: Diagnosis not present

## 2013-03-27 MED ORDER — AMOXICILLIN-POT CLAVULANATE 875-125 MG PO TABS: 1.0000 | ORAL_TABLET | Freq: Two times a day (BID) | ORAL | Status: DC

## 2013-03-27 NOTE — Progress Notes (Signed)
    Patient ID: Natalie Browning MRN: [id number], DOB: Jul 11, 1945, 67 y.o. Date of Encounter: 03/27/2013, 11:48 AM    Chief Complaint:  Chief Complaint  Patient presents with  . head cold x 1 week     HPI: 67 y.o. year old white female reports that she has been sick for over one week. Is getting no better. Has a lot of head and nasal congestion. Getting out thick dark mucus from her nose. Has some cough but that seems to be secondary to drainage down her throat. No chest congestion. Has had no fevers or chills. Has been using the Nettie pot twice a day. Also taking Mucinex and antihistamine. Taking NyQuil every night. Still unable to sleep secondary to head feeling stuffed up and having to breathe through her mouth.     Home Meds: See attached medication section for any medications that were entered at today's visit. The computer does not put those onto this list.The following list is a list of meds entered prior to today's visit.   Current Outpatient Prescriptions on File Prior to Visit  Medication Sig Dispense Refill  . Calcium Carbonate-Vitamin D (CALCIUM + D PO) Take by mouth.      Marland Kitchen MAGNESIUM PO Take by mouth.      . Omega-3 Fatty Acids (FISH OIL PO) Take by mouth.      . progesterone (PROMETRIUM) 200 MG capsule Take 1 capsule (200 mg total) by mouth as directed. Days 1-12  12 capsule  12  . Multiple Vitamin (MULTIVITAMIN) tablet Take 1 tablet by mouth daily.      . pantoprazole (PROTONIX) 40 MG tablet Take 1 tablet (40 mg total) by mouth daily.  30 tablet  11   No current facility-administered medications on file prior to visit.    Allergies:  Allergies  Allergen Reactions  . Ciprofloxacin   . Codeine Itching and Nausea Only  . Macrobid [Nitrofurantoin] Nausea And Vomiting  . Simethicone Nausea Only      Review of Systems: See HPI for pertinent ROS. All other ROS negative.    Physical Exam: Blood pressure 128/88, pulse 72, temperature 98.2 F (36.8 C), temperature  source Oral, resp. rate 20, weight 154 lb (69.854 kg)., Body mass index is 25.63 kg/(m^2). General: WNWD WF.  Appears in no acute distress. HEENT: Normocephalic, atraumatic, eyes without discharge, sclera non-icteric, nares are without discharge. Bilateral auditory canals clear, TM's are without perforation, pearly grey and translucent with reflective cone of light bilaterally. Oral cavity moist, posterior pharynx without exudate, erythema, peritonsillar abscess. No tenderness with percussion of the sinuses.  Neck: Supple. No thyromegaly. No lymphadenopathy. Lungs: Clear bilaterally to auscultation without wheezes, rales, or rhonchi. Breathing is unlabored. Heart: Regular rhythm. No murmurs, rubs, or gallops. Msk:  Strength and tone normal for age. Extremities/Skin: Warm and dry. No clubbing or cyanosis. No edema. No rashes or suspicious lesions. Neuro: Alert and oriented X 3. Moves all extremities spontaneously. Gait is normal. CNII-XII grossly in tact. Psych:  Responds to questions appropriately with a normal affect.     ASSESSMENT AND PLAN:  67 y.o. year old female with  1. Bacterial respiratory infection - amoxicillin-clavulanate (AUGMENTIN) 875-125 MG per tablet; Take 1 tablet by mouth 2 (two) times daily.  Dispense: 20 tablet; Refill: 0 Continue medications for symptomatic management in the meantime. Followup if symptoms do not resolve after completion of antibiotic.  Signed, [address] [place], [place], [facility] 03/27/2013 11:48 AM

## 2013-05-03 ENCOUNTER — Telehealth: Payer: Self-pay | Admitting: *Deleted

## 2013-05-03 NOTE — Telephone Encounter (Signed)
PA FORM FOR PROGESTERONE 200 MG DAY 1-12 OF EVERY MONTH FAXED TO [employer], WILL WAIT FOR RESPONSE.

## 2013-05-06 NOTE — Telephone Encounter (Signed)
[employer] approved the below rx effective 05/03/13-05/03/14.

## 2013-08-06 ENCOUNTER — Ambulatory Visit (INDEPENDENT_AMBULATORY_CARE_PROVIDER_SITE_OTHER): Payer: Medicare Other | Admitting: Family Medicine

## 2013-08-06 ENCOUNTER — Encounter: Payer: Self-pay | Admitting: Family Medicine

## 2013-08-06 VITALS — BP 130/80 | HR 58 | Temp 97.5°F | Resp 14 | Ht 65.5 in | Wt 153.0 lb

## 2013-08-06 DIAGNOSIS — M722 Plantar fascial fibromatosis: Secondary | ICD-10-CM

## 2013-08-06 NOTE — Progress Notes (Signed)
Subjective:    Patient ID: Natalie Browning, female    DOB: 07-26-1945, 68 y.o.   MRN: [id number]  HPI Patient has had one year of nagging pain in her left heel. It is located along the medial portion of the calcaneus near the insertion of the plantar fascia. It is worse after prolonged standing. He is also worse when she first gets out of bed in the morning or after rising from a seated position. Is pain in the heel. It does not radiate anywhere. She does not have any pain in the ankle patient not having pain in the Achilles tendon. She denies any pain in the forefoot or her toes.  She is tried over-the-counter orthotics, she has tried several stretches she is blind. She has not tried any anti-inflammatories because she is scared about side effects. She would like a referral to a podiatrist or an orthopedist. Past Medical History  Diagnosis Date  . Allergy   . Anemia   . Cataract   . Endometrial polyp   . Osteopenia 09/2010    T score -2.1 FRAX 17%/1.9%  . Cervical dysplasia 1970's   Current Outpatient Prescriptions on File Prior to Visit  Medication Sig Dispense Refill  . Calcium Carbonate-Vitamin D (CALCIUM + D PO) Take by mouth.      Marland Kitchen MAGNESIUM PO Take by mouth.      . Multiple Vitamin (MULTIVITAMIN) tablet Take 1 tablet by mouth daily.      . NONFORMULARY OR COMPOUNDED ITEM Place vaginally daily. Compounded estrogen cream      . Omega-3 Fatty Acids (FISH OIL PO) Take by mouth.      . pantoprazole (PROTONIX) 40 MG tablet Take 1 tablet (40 mg total) by mouth daily.  30 tablet  11  . progesterone (PROMETRIUM) 200 MG capsule Take 1 capsule (200 mg total) by mouth as directed. Days 1-12  12 capsule  12   No current facility-administered medications on file prior to visit.   Allergies  Allergen Reactions  . Ciprofloxacin   . Codeine Itching and Nausea Only  . Macrobid [Nitrofurantoin] Nausea And Vomiting  . Simethicone Nausea Only   History   Social History  . Marital Status:  Married    Spouse Name: N/A    Number of Children: N/A  . Years of Education: N/A   Occupational History  . Not on file.   Social History Main Topics  . Smoking status: Never Smoker   . Smokeless tobacco: Never Used  . Alcohol Use: No  . Drug Use: No  . Sexual Activity: No   Other Topics Concern  . Not on file   Social History Narrative  . No narrative on file      Review of Systems  All other systems reviewed and are negative.      Objective:   Physical Exam  Vitals reviewed. Cardiovascular: Normal rate and regular rhythm.   Pulmonary/Chest: Effort normal and breath sounds normal.  Musculoskeletal:       Left ankle: Normal.       Left foot: She exhibits tenderness and bony tenderness. She exhibits normal range of motion, no swelling, normal capillary refill, no crepitus, no deformity and no laceration.          Assessment & Plan:  1. Plantar fasciitis, left I offered the patient a trial of NSAIDs versus a cortisone injection. She deferred both of these for now. She would  like to see a podiatrist which I will gladly  arrange for this patient.   - Ambulatory referral to Podiatry

## 2013-08-12 DIAGNOSIS — Z961 Presence of intraocular lens: Secondary | ICD-10-CM | POA: Diagnosis not present

## 2013-08-12 DIAGNOSIS — H04129 Dry eye syndrome of unspecified lacrimal gland: Secondary | ICD-10-CM | POA: Diagnosis not present

## 2013-08-13 ENCOUNTER — Ambulatory Visit (INDEPENDENT_AMBULATORY_CARE_PROVIDER_SITE_OTHER): Payer: Medicare Other | Admitting: Podiatry

## 2013-08-13 ENCOUNTER — Encounter: Payer: Self-pay | Admitting: Podiatry

## 2013-08-13 VITALS — BP 152/79 | HR 55 | Ht 65.5 in | Wt 148.0 lb

## 2013-08-13 DIAGNOSIS — M21969 Unspecified acquired deformity of unspecified lower leg: Secondary | ICD-10-CM | POA: Diagnosis not present

## 2013-08-13 DIAGNOSIS — M79609 Pain in unspecified limb: Secondary | ICD-10-CM | POA: Diagnosis not present

## 2013-08-13 DIAGNOSIS — M722 Plantar fascial fibromatosis: Secondary | ICD-10-CM

## 2013-08-13 NOTE — Progress Notes (Signed)
Subjective: Not a bad pain. Some times it can be. Duration of one year and 4 month. Started December 2013.  Depending on what she was doing. Usually after been working out in yard or on feet. Does water aerobics and swimming for exercise.  Has not had any injection or treatment. Uses iced bottled water and rolls in the evening, also stretch exercise.   Review of Systems - General ROS: negative for - chills, fatigue, fever, night sweats, weight gain or weight loss Ophthalmic ROS: Has dry eyes. Lens implanted Dec 2013.  ENT ROS: negative Allergy and Immunology ROS: negative Breast ROS: negative for breast lumps Respiratory ROS: no cough, shortness of breath, or wheezing Cardiovascular ROS: no chest pain or dyspnea on exertion Gastrointestinal ROS: Has acid reflux and controlled with medication.  Genito-Urinary ROS: no dysuria, trouble voiding, or hematuria Musculoskeletal ROS: negative Neurological ROS: no TIA or stroke symptoms Dermatological ROS: negative.  Objective: Dermatologic: No open lesions. Vascular: All pedal pulses palpable. No edema or erythema noted.  Orthopedic: High arched cavus type foot with excess sagittal plane motion of the first ray L>R. Neurologic: All epicritic and tactile sensations grossly intact.  Radiographic examination reveal rectus foot, short first metatarsal in AP view and elevated first ray in Lateral view on both feet. STJ CYMA line is normal. No increase in lateral deviation angle of Calcaneocuboid angle. No other significant abnormal findings.   Assessment: Plantar fasciitis left. Hypermobile first ray L>R.  Plan: Reviewed findings and available treatment options, such as injection, NSAIA, exercise, change in shoe gear and activities, and custom orthotics. Patient requested injection today. Left heel injection given with mixture of 4 mg Dexamethasone and 5 mg Triamcinolone, and 1 cc of 0.5% Marcaine plain.  Patient tolerated well. Return in 2  weeks.

## 2013-08-13 NOTE — Patient Instructions (Signed)
Seen for pain in left heel.  Injection to the left heel given. Return in 2 weeks.  May benefit from Orthotic shoe inserts.

## 2013-08-27 ENCOUNTER — Ambulatory Visit (INDEPENDENT_AMBULATORY_CARE_PROVIDER_SITE_OTHER): Payer: Medicare Other | Admitting: Podiatry

## 2013-08-27 ENCOUNTER — Encounter: Payer: Self-pay | Admitting: Podiatry

## 2013-08-27 VITALS — BP 136/81 | HR 57

## 2013-08-27 DIAGNOSIS — M722 Plantar fascial fibromatosis: Secondary | ICD-10-CM

## 2013-08-27 NOTE — Patient Instructions (Signed)
Follow up on left heel pain. Injection helped. Proper shoe gear discussed. Will prepare Orthotics if heel pain flares back up. Continue with moderate exercise.

## 2013-08-27 NOTE — Progress Notes (Signed)
Follow up on left heel pain. Injection helped.  Discussed proper shoe gear. Will wait on Orthotics till next flare up. Return as needed.

## 2013-09-03 ENCOUNTER — Other Ambulatory Visit: Payer: Self-pay

## 2013-09-03 DIAGNOSIS — Z1231 Encounter for screening mammogram for malignant neoplasm of breast: Secondary | ICD-10-CM

## 2013-09-10 DIAGNOSIS — H04129 Dry eye syndrome of unspecified lacrimal gland: Secondary | ICD-10-CM | POA: Diagnosis not present

## 2013-09-16 DIAGNOSIS — M8580 Other specified disorders of bone density and structure, unspecified site: Secondary | ICD-10-CM

## 2013-09-16 HISTORY — DX: Other specified disorders of bone density and structure, unspecified site: M85.80

## 2013-09-17 ENCOUNTER — Other Ambulatory Visit: Payer: Self-pay | Admitting: Gynecology

## 2013-09-17 DIAGNOSIS — Z780 Asymptomatic menopausal state: Secondary | ICD-10-CM

## 2013-09-26 ENCOUNTER — Ambulatory Visit (INDEPENDENT_AMBULATORY_CARE_PROVIDER_SITE_OTHER): Payer: Medicare Other

## 2013-09-26 ENCOUNTER — Other Ambulatory Visit: Payer: Self-pay | Admitting: Gynecology

## 2013-09-26 DIAGNOSIS — M899 Disorder of bone, unspecified: Secondary | ICD-10-CM | POA: Diagnosis not present

## 2013-09-26 DIAGNOSIS — M949 Disorder of cartilage, unspecified: Secondary | ICD-10-CM

## 2013-09-26 DIAGNOSIS — Z780 Asymptomatic menopausal state: Secondary | ICD-10-CM

## 2013-09-26 DIAGNOSIS — M8580 Other specified disorders of bone density and structure, unspecified site: Secondary | ICD-10-CM

## 2013-09-27 ENCOUNTER — Encounter: Payer: Self-pay | Admitting: Gynecology

## 2013-10-08 DIAGNOSIS — H04129 Dry eye syndrome of unspecified lacrimal gland: Secondary | ICD-10-CM | POA: Diagnosis not present

## 2013-10-16 ENCOUNTER — Encounter (INDEPENDENT_AMBULATORY_CARE_PROVIDER_SITE_OTHER): Payer: Self-pay

## 2013-10-16 ENCOUNTER — Ambulatory Visit
Admission: RE | Admit: 2013-10-16 | Discharge: 2013-10-16 | Disposition: A | Discharge status: Home / Self Care | Payer: Medicare Other | Source: Ambulatory Visit

## 2013-10-16 DIAGNOSIS — Z1231 Encounter for screening mammogram for malignant neoplasm of breast: Secondary | ICD-10-CM | POA: Diagnosis not present

## 2013-11-04 ENCOUNTER — Ambulatory Visit (INDEPENDENT_AMBULATORY_CARE_PROVIDER_SITE_OTHER): Payer: Medicare Other | Admitting: Gynecology

## 2013-11-04 ENCOUNTER — Encounter: Payer: Self-pay | Admitting: Gynecology

## 2013-11-04 ENCOUNTER — Telehealth: Payer: Self-pay | Admitting: *Deleted

## 2013-11-04 ENCOUNTER — Other Ambulatory Visit (HOSPITAL_COMMUNITY)
Admission: RE | Admit: 2013-11-04 | Discharge: 2013-11-04 | Disposition: A | Discharge status: Home / Self Care | Payer: Medicare Other | Source: Ambulatory Visit | Attending: Gynecology | Admitting: Gynecology

## 2013-11-04 VITALS — BP 106/64 | Ht 65.0 in | Wt 152.8 lb

## 2013-11-04 DIAGNOSIS — Z79890 Hormone replacement therapy (postmenopausal): Secondary | ICD-10-CM | POA: Diagnosis not present

## 2013-11-04 DIAGNOSIS — Z124 Encounter for screening for malignant neoplasm of cervix: Secondary | ICD-10-CM | POA: Diagnosis not present

## 2013-11-04 DIAGNOSIS — M949 Disorder of cartilage, unspecified: Secondary | ICD-10-CM | POA: Diagnosis not present

## 2013-11-04 DIAGNOSIS — R82998 Other abnormal findings in urine: Secondary | ICD-10-CM | POA: Diagnosis not present

## 2013-11-04 DIAGNOSIS — M899 Disorder of bone, unspecified: Secondary | ICD-10-CM

## 2013-11-04 DIAGNOSIS — M8580 Other specified disorders of bone density and structure, unspecified site: Secondary | ICD-10-CM

## 2013-11-04 MED ORDER — NONFORMULARY OR COMPOUNDED ITEM: Status: DC

## 2013-11-04 MED ORDER — PROGESTERONE MICRONIZED 200 MG PO CAPS: 200.0000 mg | ORAL_CAPSULE | ORAL | Status: DC

## 2013-11-04 NOTE — Telephone Encounter (Signed)
Rx for Triest 1.25 mg (formulated estrogen cream) daily 1 ml once daily called into pharmacy.

## 2013-11-04 NOTE — Progress Notes (Signed)
Natalie Browning 1945/04/23 [id number]        68 y.o.  G0P0 for followup exam. Several issues noted below.  Past medical history,surgical history, problem list, medications, allergies, family history and social history were all reviewed and documented as reviewed in the EPIC chart.  ROS:  12 system ROS performed with pertinent positives and negatives included in the history, assessment and plan.   Additional significant findings :  None   Exam: Journalist, newspaper Filed Vitals:   11/04/13 1010  BP: 106/64  Height: 5\' 5"  (1.651 m)  Weight: 152 lb 12.8 oz (69.31 kg)   General appearance:  Normal affect, orientation and appearance. Skin: Grossly normal HEENT: Without gross lesions.  No cervical or supraclavicular adenopathy. Thyroid normal.  Lungs:  Clear without wheezing, rales or rhonchi Cardiac: RR, without RMG Abdominal:  Soft, nontender, without masses, guarding, rebound, organomegaly or hernia Breasts:  Examined lying and sitting without masses, retractions, discharge or axillary adenopathy. Pelvic:  Ext/BUS/vagina was generalized atrophic changes  Cervix with atrophic changes. Pap done  Uterus anteverted, normal size, shape and contour, midline and mobile nontender   Adnexa  Without masses or tenderness    Anus and perineum  Normal   Rectovaginal  Normal sphincter tone without palpated masses or tenderness.    Assessment/Plan:  68 y.o. G0P0 female for followup exam.   1. HRT. Patient continues on formulated estrogen cream and Prometrium 200 mg 12 days each month with no reported bleeding. I again reviewed with her the issues to include the WHI study with increased risk of stroke heart attack DVT and breast cancer. The recommendations for lowest dose for shortest period of time. Increasing risks of stroke particularly in women as they age. She has tried weaning in the past with unacceptable hot flushes. She is now using 3 instead  of 4 turns of the estrogen dispenser as previously used  and seems to tolerate this. I've recommended that she continue to decrease this and go to 2 turns and over the next year or 2 we will get her off of the estrogen. Refills for both the estrogen and progesterone provided. Call if any vaginal bleeding. 2. Atrophic genital changes. Patient without significant symptoms. Not sexually active. Will continue to monitor. 3. Osteopenia. DEXA 09/2013 T score -2.0. FRAX 15%/2.8%. Stable from prior DEXA 2012. Will monitor and repeat at 2 year interval. Increase calcium vitamin D reviewed as well as weightbearing exercise. 4. Pap smear 2012. Pap done today. Options to stop screening altogether versus less frequent screening intervals reviewed. Does have history of cryosurgery in the past a number of years ago with normal Pap smears afterwards. 5. Mammography 10/2013. Continue with annual mammography. SBE monthly reviewed. 6. Colonoscopy 02/2011. Repeat at their recommended interval. 7. Health maintenance. No routine blood work done as this is done through her primary physician's office. Followup one year, sooner as needed.   Note: This document was prepared with digital dictation and possible smart phrase technology. Any transcriptional errors that result from this process are unintentional.   Anastasio Auerbach MD, 10:52 AM 11/04/2013

## 2013-11-04 NOTE — Telephone Encounter (Signed)
Message copied by Thamas Jaegers on Mon Nov 04, 2013 11:06 AM ------      Message from: Anastasio Auerbach      Created: Mon Nov 04, 2013 10:50 AM       Call in refill to [facility] for her estrogen cream x1 year. I put through her Prometrium ------

## 2013-11-04 NOTE — Patient Instructions (Signed)
Try to decrease your estrogen cream this coming fall. Call if any vaginal bleeding or issues. Followup in one year for annual exam.

## 2013-11-04 NOTE — Addendum Note (Signed)
Addended by: Alen Blew on: 11/04/2013 11:45 AM   Modules accepted: Orders

## 2013-11-05 ENCOUNTER — Other Ambulatory Visit: Payer: Self-pay | Admitting: Gynecology

## 2013-11-05 DIAGNOSIS — H00029 Hordeolum internum unspecified eye, unspecified eyelid: Secondary | ICD-10-CM | POA: Diagnosis not present

## 2013-11-05 LAB — URINALYSIS W MICROSCOPIC + REFLEX CULTURE
BILIRUBIN URINE: NEGATIVE
Casts: NONE SEEN
Crystals: NONE SEEN
Glucose, UA: NEGATIVE mg/dL
HGB URINE DIPSTICK: NEGATIVE
Ketones, ur: NEGATIVE mg/dL
Nitrite: NEGATIVE
PROTEIN: NEGATIVE mg/dL
Specific Gravity, Urine: 1.018 (ref 1.005–1.030)
Urobilinogen, UA: 0.2 mg/dL (ref 0.0–1.0)
pH: 7.5 (ref 5.0–8.0)

## 2013-11-05 LAB — CYTOLOGY - PAP

## 2013-11-05 MED ORDER — FLUCONAZOLE 150 MG PO TABS: 150.0000 mg | ORAL_TABLET | Freq: Once | ORAL | Status: DC

## 2013-11-05 NOTE — Progress Notes (Signed)
Called into pharmacy

## 2013-11-07 LAB — URINE CULTURE: Colony Count: >=100000

## 2013-12-10 DIAGNOSIS — H04129 Dry eye syndrome of unspecified lacrimal gland: Secondary | ICD-10-CM | POA: Diagnosis not present

## 2014-01-09 ENCOUNTER — Ambulatory Visit (INDEPENDENT_AMBULATORY_CARE_PROVIDER_SITE_OTHER): Payer: Medicare Other | Admitting: *Deleted

## 2014-01-09 DIAGNOSIS — Z23 Encounter for immunization: Secondary | ICD-10-CM

## 2014-03-27 DIAGNOSIS — H10503 Unspecified blepharoconjunctivitis, bilateral: Secondary | ICD-10-CM | POA: Diagnosis not present

## 2014-05-05 DIAGNOSIS — L918 Other hypertrophic disorders of the skin: Secondary | ICD-10-CM | POA: Diagnosis not present

## 2014-05-05 DIAGNOSIS — D225 Melanocytic nevi of trunk: Secondary | ICD-10-CM | POA: Diagnosis not present

## 2014-05-05 DIAGNOSIS — L820 Inflamed seborrheic keratosis: Secondary | ICD-10-CM | POA: Diagnosis not present

## 2014-05-28 ENCOUNTER — Telehealth: Payer: Self-pay | Admitting: *Deleted

## 2014-05-28 NOTE — Telephone Encounter (Signed)
Form for teir exception  faxed to [facility] , will wait for response.

## 2014-06-04 NOTE — Telephone Encounter (Signed)
Progesterone Rx has been approved and will end on 05/30/2015.

## 2014-08-15 DIAGNOSIS — H5310 Unspecified subjective visual disturbances: Secondary | ICD-10-CM | POA: Diagnosis not present

## 2014-08-15 DIAGNOSIS — H04123 Dry eye syndrome of bilateral lacrimal glands: Secondary | ICD-10-CM | POA: Diagnosis not present

## 2014-08-15 DIAGNOSIS — H01001 Unspecified blepharitis right upper eyelid: Secondary | ICD-10-CM | POA: Diagnosis not present

## 2014-08-15 DIAGNOSIS — H26493 Other secondary cataract, bilateral: Secondary | ICD-10-CM | POA: Diagnosis not present

## 2014-09-03 DIAGNOSIS — H04122 Dry eye syndrome of left lacrimal gland: Secondary | ICD-10-CM | POA: Diagnosis not present

## 2014-09-03 DIAGNOSIS — H04121 Dry eye syndrome of right lacrimal gland: Secondary | ICD-10-CM | POA: Diagnosis not present

## 2014-09-11 ENCOUNTER — Encounter: Payer: Self-pay | Admitting: Family Medicine

## 2014-09-11 ENCOUNTER — Ambulatory Visit (INDEPENDENT_AMBULATORY_CARE_PROVIDER_SITE_OTHER): Payer: Medicare Other | Admitting: Family Medicine

## 2014-09-11 VITALS — BP 118/68 | HR 68 | Temp 102.7°F | Resp 20 | Ht 65.5 in | Wt 148.0 lb

## 2014-09-11 DIAGNOSIS — J329 Chronic sinusitis, unspecified: Secondary | ICD-10-CM | POA: Diagnosis not present

## 2014-09-11 DIAGNOSIS — J310 Chronic rhinitis: Secondary | ICD-10-CM

## 2014-09-11 MED ORDER — AMOXICILLIN-POT CLAVULANATE 875-125 MG PO TABS: 1.0000 | ORAL_TABLET | Freq: Two times a day (BID) | ORAL | Status: DC

## 2014-09-11 NOTE — Progress Notes (Signed)
Subjective:    Patient ID: Natalie Browning, female    DOB: [birth year], 69 y.o.   MRN: [id number]  HPI Patient has had 3 days of symptoms. Symptoms primarily involve sneezing, sinus pressure, postnasal drip, sore throat secondary to postnasal drip, sinus pressure and sinus headache. She is having a fever to 103. She has a mild cough but not severe. She denies any nausea vomiting or diarrhea. She denies any rash. She denies any dysuria. On examination today, lungs are clear to auscultation bilaterally. Examination of the posterior oropharynx is unremarkable. Patient has swollen nasal mucosa and nasal turbinates with almost near occlusion of her nostrils. She does have some sinus tenderness to palpation. Past Medical History  Diagnosis Date  . Allergy   . Anemia   . Cataract   . Osteopenia 09/2013    T score -2.0 FRAX 15%/2.8% overall stable from prior DEXA 2012  . Cervical dysplasia 1970's   Past Surgical History  Procedure Laterality Date  . Tonsillectomy    . Upper gastrointestinal endoscopy  2006  . Nasal sinus surgery    . Colposcopy    . Gynecologic cryosurgery    . Cataract surg    . Dilation and curettage of uterus  03/18/2010    Endometrial polyp   Current Outpatient Prescriptions on File Prior to Visit  Medication Sig Dispense Refill  . Calcium Carbonate-Vitamin D (CALCIUM + D PO) Take by mouth.    Marland Kitchen MAGNESIUM PO Take by mouth.    . Multiple Vitamin (MULTIVITAMIN) tablet Take 1 tablet by mouth daily.    . NONFORMULARY OR COMPOUNDED ITEM Triest 1.25 mg (formulated estrogen cream) daily  1 ml once daily 90 each 3  . Omega-3 Fatty Acids (FISH OIL PO) Take by mouth.    Marland Kitchen omeprazole (PRILOSEC) 20 MG capsule Take 20 mg by mouth daily.     . progesterone (PROMETRIUM) 200 MG capsule Take 1 capsule (200 mg total) by mouth as directed. Days 1-12 12 capsule 12  . RESTASIS 0.05 % ophthalmic emulsion      No current facility-administered medications on file prior to visit.   Allergies   Allergen Reactions  . Ciprofloxacin   . Codeine Itching and Nausea Only  . Macrobid [Nitrofurantoin] Nausea And Vomiting  . Simethicone Nausea Only   History   Social History  . Marital Status: Married    Spouse Name: N/A  . Number of Children: N/A  . Years of Education: N/A   Occupational History  . Not on file.   Social History Main Topics  . Smoking status: Never Smoker   . Smokeless tobacco: Never Used  . Alcohol Use: No  . Drug Use: No  . Sexual Activity: No   Other Topics Concern  . Not on file   Social History Narrative      Review of Systems  All other systems reviewed and are negative.      Objective:   Physical Exam  Constitutional: She appears well-developed and well-nourished. No distress.  HENT:  Right Ear: Tympanic membrane, external ear and ear canal normal.  Left Ear: Tympanic membrane, external ear and ear canal normal.  Nose: Mucosal edema and rhinorrhea present. Right sinus exhibits maxillary sinus tenderness and frontal sinus tenderness.  Mouth/Throat: Oropharynx is clear and moist.  Cardiovascular: Normal rate, regular rhythm and normal heart sounds.   No murmur heard. Pulmonary/Chest: Effort normal and breath sounds normal. No respiratory distress. She has no wheezes. She has no rales.  Lymphadenopathy:  She has no cervical adenopathy.  Skin: She is not diaphoretic.  Vitals reviewed.         Assessment & Plan:  Rhinosinusitis - Plan: amoxicillin-clavulanate (AUGMENTIN) 875-125 MG per tablet  I believe the patient has a sinus infection versus a viral syndrome similar to the flu. Given the severity of her fever and other symptoms I'll treat her empirically as a sinus infection with Augmentin 875 mg by mouth twice a day for 10 days. Recheck in 48 hours if no better or immediately if worse or if symptoms change.

## 2014-10-07 ENCOUNTER — Other Ambulatory Visit: Payer: Self-pay

## 2014-10-07 DIAGNOSIS — Z1231 Encounter for screening mammogram for malignant neoplasm of breast: Secondary | ICD-10-CM

## 2014-11-04 ENCOUNTER — Ambulatory Visit
Admission: RE | Admit: 2014-11-04 | Discharge: 2014-11-04 | Disposition: A | Discharge status: Home / Self Care | Payer: Medicare Other | Source: Ambulatory Visit

## 2014-11-04 DIAGNOSIS — Z1231 Encounter for screening mammogram for malignant neoplasm of breast: Secondary | ICD-10-CM

## 2014-11-27 ENCOUNTER — Other Ambulatory Visit: Payer: Self-pay | Admitting: Gynecology

## 2014-11-27 NOTE — Telephone Encounter (Signed)
CE is scheduled 01/01/15 at 9:30am.

## 2014-12-10 DIAGNOSIS — D2311 Other benign neoplasm of skin of right eyelid, including canthus: Secondary | ICD-10-CM | POA: Diagnosis not present

## 2014-12-10 DIAGNOSIS — H26493 Other secondary cataract, bilateral: Secondary | ICD-10-CM | POA: Diagnosis not present

## 2014-12-10 DIAGNOSIS — Z961 Presence of intraocular lens: Secondary | ICD-10-CM | POA: Diagnosis not present

## 2014-12-10 DIAGNOSIS — H04123 Dry eye syndrome of bilateral lacrimal glands: Secondary | ICD-10-CM | POA: Diagnosis not present

## 2015-01-01 ENCOUNTER — Encounter: Payer: Medicare Other | Admitting: Gynecology

## 2015-01-01 DIAGNOSIS — H26491 Other secondary cataract, right eye: Secondary | ICD-10-CM | POA: Diagnosis not present

## 2015-01-08 DIAGNOSIS — H2640 Unspecified secondary cataract: Secondary | ICD-10-CM | POA: Diagnosis not present

## 2015-01-08 DIAGNOSIS — H26492 Other secondary cataract, left eye: Secondary | ICD-10-CM | POA: Diagnosis not present

## 2015-01-13 ENCOUNTER — Encounter: Payer: Self-pay | Admitting: Gynecology

## 2015-01-13 ENCOUNTER — Ambulatory Visit (INDEPENDENT_AMBULATORY_CARE_PROVIDER_SITE_OTHER): Payer: Medicare Other | Admitting: Gynecology

## 2015-01-13 VITALS — BP 120/76 | Ht 65.0 in | Wt 151.0 lb

## 2015-01-13 DIAGNOSIS — Z01419 Encounter for gynecological examination (general) (routine) without abnormal findings: Secondary | ICD-10-CM | POA: Diagnosis not present

## 2015-01-13 DIAGNOSIS — M8580 Other specified disorders of bone density and structure, unspecified site: Secondary | ICD-10-CM

## 2015-01-13 DIAGNOSIS — Z79890 Hormone replacement therapy (postmenopausal): Secondary | ICD-10-CM | POA: Diagnosis not present

## 2015-01-13 DIAGNOSIS — N952 Postmenopausal atrophic vaginitis: Secondary | ICD-10-CM

## 2015-01-13 MED ORDER — NONFORMULARY OR COMPOUNDED ITEM: Status: DC

## 2015-01-13 MED ORDER — PROGESTERONE MICRONIZED 200 MG PO CAPS: ORAL_CAPSULE | ORAL | Status: DC

## 2015-01-13 NOTE — Patient Instructions (Signed)

## 2015-01-13 NOTE — Progress Notes (Addendum)
Natalie Browning 12-Sep-1945 [id number]        69 y.o.  G0P0 for breast and pelvic exam. Several issues noted below.  Past medical history,surgical history, problem list, medications, allergies, family history and social history were all reviewed and documented as reviewed in the EPIC chart.  ROS:  Performed with pertinent positives and negatives included in the history, assessment and plan.   Additional significant findings :  none   Exam: Kim Counsellor Vitals:   01/13/15 1425  BP: 120/76  Height: 5\' 5"  (1.651 m)  Weight: 151 lb (68.493 kg)   General appearance:  Normal affect, orientation and appearance. Skin: Grossly normal HEENT: Without gross lesions.  No cervical or supraclavicular adenopathy. Thyroid normal.  Lungs:  Clear without wheezing, rales or rhonchi Cardiac: RR, without RMG Abdominal:  Soft, nontender, without masses, guarding, rebound, organomegaly or hernia Breasts:  Examined lying and sitting without masses, retractions, discharge or axillary adenopathy. Pelvic:  Ext/BUS/vagina with atrophic changes  Cervix with atrophic changes  Uterus anteverted, normal size, shape and contour, midline and mobile nontender   Adnexa  Without masses or tenderness    Anus and perineum  Normal   Rectovaginal  Normal sphincter tone without palpated masses or tenderness.    Assessment/Plan:  69 y.o. G0P0 female for breast and pelvic exam.   1. Postmenopausal/atrophic genital changes/HRT. Patient continues on formulated estrogen cream and Prometrium 200 mg 12 days each month with no withdrawal bleeding. She has cut her dose down in half. She was using four turns and now is using 1-2 turns of her estrogen cream. Has tried stopping altogether and has acceptable night sweats. Does no bleeding at the end of the progesterone.  I again reviewed the whole issue of HRT particularly in the 70s. Increased risk of stroke heart attack DVT possible breast cancer all reviewed. Patient clearly  understands the issues and risks wants to continue and I refilled her 1 year. 2. Osteopenia. DEXA 2015 T score -2 FRAX 15%/2.8%. Stable from prior DEXA 2012. Plan repeat DEXA next year at two-year interval. Increased calcium vitamin D. 3. Pap smear 2015. No Pap smear done today. Does have a history of cryosurgery in the past and number of years ago with normal Pap smears since. 4. Colonoscopy 2012. Repeat at their recommended interval. 5. Mammography 10/2014. Continue with annual mammography. SBE monthly reviewed. 6. Health maintenance. No routine lab work done as this is done at her primary physician's office. Follow up 1 year, sooner as needed.   Anastasio Auerbach MD, 2:53 PM 01/13/2015

## 2015-01-14 ENCOUNTER — Other Ambulatory Visit: Payer: Self-pay | Admitting: Gynecology

## 2015-01-22 ENCOUNTER — Ambulatory Visit (INDEPENDENT_AMBULATORY_CARE_PROVIDER_SITE_OTHER): Payer: Medicare Other | Admitting: *Deleted

## 2015-01-22 DIAGNOSIS — Z23 Encounter for immunization: Secondary | ICD-10-CM | POA: Diagnosis not present

## 2015-01-22 NOTE — Progress Notes (Signed)
Patient ID: Natalie Browning, female   DOB: 16-Mar-1946, 69 y.o.   MRN: [id number]  Patient seen in office for Influenza Vaccination.   Tolerated IM administration well.   Immunization history updated.

## 2015-02-19 ENCOUNTER — Encounter: Payer: Medicare Other | Admitting: Gynecology

## 2015-03-09 ENCOUNTER — Telehealth: Payer: Self-pay | Admitting: Family Medicine

## 2015-03-09 NOTE — Telephone Encounter (Signed)
Patient would like referral to dr outlaw if possible for waking up burping every night, said she has been seen in the past for gerd  Please call her at [phone]

## 2015-03-10 ENCOUNTER — Encounter: Payer: Self-pay | Admitting: Family Medicine

## 2015-03-10 ENCOUNTER — Ambulatory Visit (INDEPENDENT_AMBULATORY_CARE_PROVIDER_SITE_OTHER): Payer: Medicare Other | Admitting: Family Medicine

## 2015-03-10 VITALS — BP 128/68 | HR 60 | Temp 98.3°F | Resp 16 | Ht 65.5 in | Wt 146.0 lb

## 2015-03-10 DIAGNOSIS — Z23 Encounter for immunization: Secondary | ICD-10-CM | POA: Diagnosis not present

## 2015-03-10 DIAGNOSIS — K219 Gastro-esophageal reflux disease without esophagitis: Secondary | ICD-10-CM | POA: Diagnosis not present

## 2015-03-10 MED ORDER — OMEPRAZOLE 40 MG PO CPDR: 40.0000 mg | DELAYED_RELEASE_CAPSULE | Freq: Every day | ORAL | Status: DC

## 2015-03-10 NOTE — Progress Notes (Signed)
Subjective:    Patient ID: Natalie Browning, female    DOB: 12-29-1945, 69 y.o.   MRN: [id number]  HPI  2 years ago, was tested for H pylori and found to be negative.  See OV 12/10/12.  Patient discontinued omeprazole once symptoms improved. Recently over the last few weeks she has developed significant dyspepsia, central chest discomfort. She also reports excessive gas production and burping at night particularly when she lies down. She denies any black tarry stools, hematochezia, weight loss, nausea or vomiting. She does report frequent heartburn Past Medical History  Diagnosis Date  . Allergy   . Anemia   . Cataract   . Osteopenia 09/2013    T score -2.0 FRAX 15%/2.8% overall stable from prior DEXA 2012  . Cervical dysplasia 1970's  . Acid reflux    Past Surgical History  Procedure Laterality Date  . Tonsillectomy    . Upper gastrointestinal endoscopy  2006  . Nasal sinus surgery    . Colposcopy    . Gynecologic cryosurgery    . Cataract surg    . Dilation and curettage of uterus  03/18/2010    Endometrial polyp  . Eye surgery      Laser   Current Outpatient Prescriptions on File Prior to Visit  Medication Sig Dispense Refill  . Calcium Carbonate-Vitamin D (CALCIUM + D PO) Take by mouth.    . Estriol POWD APPLY 1ML (4 CLICKS) ONCE A DAY. 30 g 10  . MAGNESIUM PO Take by mouth.    . NONFORMULARY OR COMPOUNDED ITEM Triest 1.25 mg (formulated estrogen cream) daily  1 ml once daily 90 each 3  . Omega-3 Fatty Acids (FISH OIL PO) Take by mouth.    Marland Kitchen omeprazole (PRILOSEC) 20 MG capsule Take 20 mg by mouth daily.     . progesterone (PROMETRIUM) 200 MG capsule TAKE 1 CAPSULE EVERY DAY ON DAYS 1-12. 12 capsule 12  . RESTASIS 0.05 % ophthalmic emulsion      No current facility-administered medications on file prior to visit.   Allergies  Allergen Reactions  . Ciprofloxacin   . Codeine Itching and Nausea Only  . Macrobid [Nitrofurantoin] Nausea And Vomiting  . Simethicone Nausea  Only   Social History   Social History  . Marital Status: Married    Spouse Name: N/A  . Number of Children: N/A  . Years of Education: N/A   Occupational History  . Not on file.   Social History Main Topics  . Smoking status: Never Smoker   . Smokeless tobacco: Never Used  . Alcohol Use: No  . Drug Use: No  . Sexual Activity: No     Comment: 1st intercourse 68 yo-1 partner   Other Topics Concern  . Not on file   Social History Narrative     Review of Systems  All other systems reviewed and are negative.      Objective:   Physical Exam  Cardiovascular: Normal rate, regular rhythm and normal heart sounds.   Pulmonary/Chest: Effort normal and breath sounds normal.  Abdominal: Soft. Bowel sounds are normal. She exhibits no distension. There is no tenderness. There is no rebound.  Vitals reviewed.         Assessment & Plan:  Gastroesophageal reflux disease without esophagitis - Plan: omeprazole (PRILOSEC) 40 MG capsule  Need for prophylactic vaccination against Streptococcus pneumoniae (pneumococcus) - Plan: Pneumococcal conjugate vaccine 13-valent IM  Begin omeprazole 40 mg by mouth daily. Recheck in 2 weeks if no  better or sooner if worse.

## 2015-03-10 NOTE — Telephone Encounter (Signed)
Contacted pt and she stated she is having acid reflux mostly when she is waking up I advised pt since we have not seen her since May she would need to make appt to be seen so that we can have documentation and place the referral. Pt coming in today

## 2015-03-30 ENCOUNTER — Other Ambulatory Visit (HOSPITAL_COMMUNITY): Payer: Self-pay | Admitting: Gastroenterology

## 2015-03-30 DIAGNOSIS — R198 Other specified symptoms and signs involving the digestive system and abdomen: Secondary | ICD-10-CM | POA: Diagnosis not present

## 2015-03-30 DIAGNOSIS — R6881 Early satiety: Secondary | ICD-10-CM

## 2015-03-30 DIAGNOSIS — K219 Gastro-esophageal reflux disease without esophagitis: Secondary | ICD-10-CM | POA: Diagnosis not present

## 2015-03-30 DIAGNOSIS — Z1211 Encounter for screening for malignant neoplasm of colon: Secondary | ICD-10-CM | POA: Diagnosis not present

## 2015-04-06 ENCOUNTER — Ambulatory Visit (HOSPITAL_COMMUNITY)
Admission: RE | Admit: 2015-04-06 | Discharge: 2015-04-06 | Disposition: A | Discharge status: Home / Self Care | Payer: Medicare Other | Source: Ambulatory Visit | Attending: Gastroenterology | Admitting: Gastroenterology

## 2015-04-06 DIAGNOSIS — R6881 Early satiety: Secondary | ICD-10-CM | POA: Diagnosis not present

## 2015-04-06 MED ORDER — TECHNETIUM TC 99M SULFUR COLLOID
2.0000 | Freq: Once | INTRAVENOUS | Status: AC | PRN
  Administered 2015-04-06: 2 via ORAL

## 2015-04-28 ENCOUNTER — Ambulatory Visit (INDEPENDENT_AMBULATORY_CARE_PROVIDER_SITE_OTHER): Payer: Medicare Other | Admitting: Family Medicine

## 2015-04-28 ENCOUNTER — Encounter: Payer: Self-pay | Admitting: Family Medicine

## 2015-04-28 VITALS — BP 112/68 | HR 76 | Temp 98.4°F | Resp 14 | Ht 65.5 in | Wt 150.0 lb

## 2015-04-28 DIAGNOSIS — J019 Acute sinusitis, unspecified: Secondary | ICD-10-CM

## 2015-04-28 MED ORDER — AMOXICILLIN 875 MG PO TABS: 875.0000 mg | ORAL_TABLET | Freq: Two times a day (BID) | ORAL | Status: DC

## 2015-04-28 NOTE — Progress Notes (Signed)
Subjective:    Patient ID: Natalie Browning, female    DOB: 07-03-1945, 70 y.o.   MRN: [id number]  HPI Symptoms began one week ago.  Symptoms consist of rhinorrhea, sinus pressure, head congestion, postnasal drip, and nonproductive cough. She denies any sinus pain. She denies any fever. She does have a headache around her frontal sinuses. She denies any shortness of breath or pleurisy. Past Medical History  Diagnosis Date  . Allergy   . Anemia   . Cataract   . Osteopenia 09/2013    T score -2.0 FRAX 15%/2.8% overall stable from prior DEXA 2012  . Cervical dysplasia 1970's  . Acid reflux    Past Surgical History  Procedure Laterality Date  . Tonsillectomy    . Upper gastrointestinal endoscopy  2006  . Nasal sinus surgery    . Colposcopy    . Gynecologic cryosurgery    . Cataract surg    . Dilation and curettage of uterus  03/18/2010    Endometrial polyp  . Eye surgery      Laser   Current Outpatient Prescriptions on File Prior to Visit  Medication Sig Dispense Refill  . Calcium Carbonate-Vitamin D (CALCIUM + D PO) Take by mouth.    . Estriol POWD APPLY 1ML (4 CLICKS) ONCE A DAY. 30 g 10  . MAGNESIUM PO Take by mouth.    . NONFORMULARY OR COMPOUNDED ITEM Triest 1.25 mg (formulated estrogen cream) daily  1 ml once daily 90 each 3  . Omega-3 Fatty Acids (FISH OIL PO) Take by mouth.    Marland Kitchen omeprazole (PRILOSEC) 40 MG capsule Take 1 capsule (40 mg total) by mouth daily. 30 capsule 11  . progesterone (PROMETRIUM) 200 MG capsule TAKE 1 CAPSULE EVERY DAY ON DAYS 1-12. 12 capsule 12  . RESTASIS 0.05 % ophthalmic emulsion      No current facility-administered medications on file prior to visit.   Allergies  Allergen Reactions  . Ciprofloxacin   . Codeine Itching and Nausea Only  . Macrobid [Nitrofurantoin] Nausea And Vomiting  . Simethicone Nausea Only   Social History   Social History  . Marital Status: Married    Spouse Name: N/A  . Number of Children: N/A  . Years of  Education: N/A   Occupational History  . Not on file.   Social History Main Topics  . Smoking status: Never Smoker   . Smokeless tobacco: Never Used  . Alcohol Use: No  . Drug Use: No  . Sexual Activity: No     Comment: 1st intercourse 70 yo-1 partner   Other Topics Concern  . Not on file   Social History Narrative      Review of Systems  All other systems reviewed and are negative.      Objective:   Physical Exam  Constitutional: She appears well-developed and well-nourished.  HENT:  Right Ear: Tympanic membrane, external ear and ear canal normal.  Left Ear: Tympanic membrane, external ear and ear canal normal.  Nose: Mucosal edema and rhinorrhea present. Right sinus exhibits no maxillary sinus tenderness and no frontal sinus tenderness. Left sinus exhibits no maxillary sinus tenderness and no frontal sinus tenderness.  Mouth/Throat: Oropharynx is clear and moist. No oropharyngeal exudate.  Cardiovascular: Normal rate, regular rhythm and normal heart sounds.   Pulmonary/Chest: Effort normal and breath sounds normal. No respiratory distress. She has no wheezes. She has no rales.  Lymphadenopathy:    She has no cervical adenopathy.  Vitals reviewed.  Assessment & Plan:  Acute rhinosinusitis - Plan: amoxicillin (AMOXIL) 875 MG tablet  Symptoms are consistent with a viral upper respiratory infection/sinusitis. There is no indication for antibiotics at the present time. I recommended Sudafed 60 mg every 6 hours for head congestion. I recommended Mucinex as a mucolytic. Also recommended a neti-pot.  Recommended tincture of time for the next 4 days. If symptoms are not better in 4 days or she develops high fever and severe sinus pain I did give the patient a prescription for amoxicillin to begin taking only at that time for what would  Then be a secondary sinus infection.

## 2015-05-18 ENCOUNTER — Telehealth: Payer: Self-pay | Admitting: *Deleted

## 2015-05-18 NOTE — Telephone Encounter (Signed)
Prior Authorization form faxed to [facility] for non-formulary request will wait for response.

## 2015-05-21 DIAGNOSIS — K219 Gastro-esophageal reflux disease without esophagitis: Secondary | ICD-10-CM | POA: Diagnosis not present

## 2015-05-21 DIAGNOSIS — R198 Other specified symptoms and signs involving the digestive system and abdomen: Secondary | ICD-10-CM | POA: Diagnosis not present

## 2015-05-21 DIAGNOSIS — Z1211 Encounter for screening for malignant neoplasm of colon: Secondary | ICD-10-CM | POA: Diagnosis not present

## 2015-05-26 NOTE — Telephone Encounter (Signed)
Per [employer] progesterone was denied as it is not being prescribed for FDA or medically accepted use.  It advised a formulary teir exception form be faxed,this was done, will wait for response.

## 2015-06-01 NOTE — Telephone Encounter (Signed)
[employer] faxed back response for the below and tier exception was denied stating progesterone is a non-formulary drug and are no eligible for tier exceptions.

## 2015-06-02 ENCOUNTER — Telehealth: Payer: Self-pay | Admitting: *Deleted

## 2015-06-02 NOTE — Telephone Encounter (Signed)
It is okay to switch to the Prometrium 200 mg and take it the same way she was taking the other for the first 12 days of each month.

## 2015-06-02 NOTE — Telephone Encounter (Signed)
Pt informed with the below will call back if she wishes to continue with the below, pt said she may want to try another progesterone Rx.

## 2015-06-02 NOTE — Telephone Encounter (Signed)
Pt takes Triest 1.25 mg formulated estrogen cream and progesterone micronized compound capsules. Her insurance has now declined to pay for non-formulary drug progesterone micronized compound. They will however cover generic Prometrium (progesterone) 200 mg capsule at local pharmacy rather than [facility]. Pt wanted to know if safe to switch? Please advise

## 2015-06-18 ENCOUNTER — Telehealth: Payer: Self-pay | Admitting: *Deleted

## 2015-06-18 MED ORDER — NONFORMULARY OR COMPOUNDED ITEM: Status: DC

## 2015-06-18 NOTE — Telephone Encounter (Signed)
Pt called stating her insurance has denied coverage for progesterone 200 mg capsules and Rx is expensive with out insurance. Pt said she can get a compound bio-identical progesterone 200 mg at [facility] which is much cheaper. Pt asked if you are okay with having Rx for this? Please advise

## 2015-06-18 NOTE — Telephone Encounter (Signed)
Pt informed, Rx called in.  

## 2015-06-18 NOTE — Telephone Encounter (Signed)
I am okay with the [facility] formulated 200 mg progesterone bioidentical

## 2015-07-15 ENCOUNTER — Ambulatory Visit (INDEPENDENT_AMBULATORY_CARE_PROVIDER_SITE_OTHER): Payer: Medicare Other | Admitting: Family Medicine

## 2015-07-15 ENCOUNTER — Encounter: Payer: Self-pay | Admitting: Family Medicine

## 2015-07-15 VITALS — BP 130/72 | HR 80 | Temp 98.8°F | Resp 16 | Ht 65.0 in | Wt 149.0 lb

## 2015-07-15 DIAGNOSIS — J069 Acute upper respiratory infection, unspecified: Secondary | ICD-10-CM | POA: Diagnosis not present

## 2015-07-15 DIAGNOSIS — H109 Unspecified conjunctivitis: Secondary | ICD-10-CM

## 2015-07-15 MED ORDER — POLYMYXIN B-TRIMETHOPRIM 10000-0.1 UNIT/ML-% OP SOLN: OPHTHALMIC | Status: DC

## 2015-07-15 NOTE — Patient Instructions (Signed)
Take the amoxicillin Take the mucinex  Continue cough medicine Use eye drop as prescribed

## 2015-07-15 NOTE — Progress Notes (Signed)
Patient ID: Natalie Browning, female   DOB: 1945-07-07, 70 y.o.   MRN: [id number]    Subjective:    Patient ID: Natalie Browning, female    DOB: 1946/01/24, 70 y.o.   MRN: [id number]  Patient presents for Illness  Patient here with illness. For the past week she has had progressively worse cough with congestion she's had some sinus drainage. Each day seems to come worse. She had MAXIMUM TEMPERATURE fever of 100F yesterday. 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 She also noticed some redness to her conjunctiva in her sclerae yesterday she thinks that she may wipe her nose and touched her eye. She's been taking Mucinex and NyQuil Note she was sick back in January she never started the amoxicillin as she improved. She still has this at home.   Review Of Systems:  GEN- denies fatigue, +fever, weight loss,weakness, recent illness HEENT- denies eye drainage, change in vision, +nasal discharge, CVS- denies chest pain, palpitations RESP- denies SOB, +cough, wheeze ABD- denies N/V, change in stools, abd pain GU- denies dysuria, hematuria, dribbling, incontinence MSK- denies joint pain, muscle aches, injury Neuro- denies headache, dizziness, syncope, seizure activity       Objective:    BP 130/72 mmHg  Pulse 80  Temp(Src) 98.8 F (37.1 C) (Oral)  Resp 16  Ht 5\' 5"  (1.651 m)  Wt 149 lb (67.586 kg)  BMI 24.79 kg/m2 GEN- NAD, alert and oriented x3 HEENT- PERRL, EOMI, non injected sclera, left pink conjunctiva, injected right lower conjunctiva, injected sclera MMM, oropharynx clear, TM clear bilat no effusion,  No maxillary sinus tenderness, +Nasal drainage  Neck- Supple, no LAD CVS- RRR, no murmur RESP-CTAB EXT- No edema Pulses- Radial 2+         Assessment & Plan:      Problem List Items Addressed This Visit    None    Visit Diagnoses    Conjunctivitis of right eye    -  Primary    Polytrim drops to eye    Acute URI        Worsening symptoms,  previous infection 6 weeks ago, start amox, continue mucinex, nyquil       Note: This dictation was prepared with Dragon dictation along with smaller phrase technology. Any transcriptional errors that result from this process are unintentional.

## 2015-08-03 ENCOUNTER — Encounter: Payer: Self-pay | Admitting: Family Medicine

## 2015-08-03 ENCOUNTER — Ambulatory Visit (INDEPENDENT_AMBULATORY_CARE_PROVIDER_SITE_OTHER): Payer: Medicare Other | Admitting: Family Medicine

## 2015-08-03 VITALS — BP 130/74 | HR 78 | Temp 98.0°F | Resp 14 | Ht 65.5 in | Wt 151.0 lb

## 2015-08-03 DIAGNOSIS — R0982 Postnasal drip: Secondary | ICD-10-CM

## 2015-08-03 DIAGNOSIS — J029 Acute pharyngitis, unspecified: Secondary | ICD-10-CM | POA: Diagnosis not present

## 2015-08-03 LAB — STREP GROUP A AG, W/REFLEX TO CULT: STREGTOCOCCUS GROUP A AG SCREEN: NOT DETECTED

## 2015-08-03 MED ORDER — FLUTICASONE PROPIONATE 50 MCG/ACT NA SUSP: 2.0000 | Freq: Every day | NASAL | Status: DC

## 2015-08-03 NOTE — Progress Notes (Signed)
Subjective:    Patient ID: Natalie Browning, female    DOB: 1945/07/19, 70 y.o.   MRN: [id number]  HPI The patient states that she has felt poorly since March. During the day she feels fine. However she awakens frequently at night with a sore throat and mucus in her throat. She reports congestion, rhinorrhea, and a nonproductive cough that is usually worse at night when she first gets up in the morning. This is been going on now for 4 weeks. She also reports itchy watery eyes and head congestion. She denies any fevers or chills. She is concerned that she has strep throat Past Medical History  Diagnosis Date  . Allergy   . Anemia   . Cataract   . Osteopenia 09/2013    T score -2.0 FRAX 15%/2.8% overall stable from prior DEXA 2012  . Cervical dysplasia 1970's  . Acid reflux    Past Surgical History  Procedure Laterality Date  . Tonsillectomy    . Upper gastrointestinal endoscopy  2006  . Nasal sinus surgery    . Colposcopy    . Gynecologic cryosurgery    . Cataract surg    . Dilation and curettage of uterus  03/18/2010    Endometrial polyp  . Eye surgery      Laser   Current Outpatient Prescriptions on File Prior to Visit  Medication Sig Dispense Refill  . Calcium Carbonate-Vitamin D (CALCIUM + D PO) Take by mouth. Reported on 07/15/2015    . MAGNESIUM PO Take by mouth.    . NONFORMULARY OR COMPOUNDED ITEM Triest 1.25 mg (formulated estrogen cream) daily  1 ml once daily 90 each 3  . NONFORMULARY OR COMPOUNDED ITEM compound bio-identical progesterone 200 mg 1 po daily day 1-12 monthly 12 each 5  . Omega-3 Fatty Acids (FISH OIL PO) Take by mouth.    . RESTASIS 0.05 % ophthalmic emulsion     . trimethoprim-polymyxin b (POLYTRIM) ophthalmic solution 1 drop Four times a day for 5 days 10 mL 0   No current facility-administered medications on file prior to visit.   Allergies  Allergen Reactions  . Ciprofloxacin   . Codeine Itching and Nausea Only  . Macrobid [Nitrofurantoin] Nausea  And Vomiting  . Simethicone Nausea Only   Social History   Social History  . Marital Status: Married    Spouse Name: N/A  . Number of Children: N/A  . Years of Education: N/A   Occupational History  . Not on file.   Social History Main Topics  . Smoking status: Never Smoker   . Smokeless tobacco: Never Used  . Alcohol Use: No  . Drug Use: No  . Sexual Activity: No     Comment: 1st intercourse 72 yo-1 partner   Other Topics Concern  . Not on file   Social History Narrative      Review of Systems  All other systems reviewed and are negative.      Objective:   Physical Exam  HENT:  Right Ear: Tympanic membrane, external ear and ear canal normal.  Left Ear: Tympanic membrane, external ear and ear canal normal.  Nose: Mucosal edema and rhinorrhea present. Right sinus exhibits no maxillary sinus tenderness and no frontal sinus tenderness. Left sinus exhibits no maxillary sinus tenderness and no frontal sinus tenderness.  Mouth/Throat: Oropharynx is clear and moist. No oropharyngeal exudate.  Eyes: Conjunctivae are normal.  Neck: Neck supple.  Cardiovascular: Normal rate, regular rhythm and normal heart sounds.  Pulmonary/Chest: Effort normal and breath sounds normal. No respiratory distress. She has no wheezes. She has no rales.  Lymphadenopathy:    She has no cervical adenopathy.  Vitals reviewed.         Assessment & Plan:  Sore throat - Plan: STREP GROUP A AG, W/REFLEX TO CULT  Post-nasal drip - Plan: fluticasone (FLONASE) 50 MCG/ACT nasal spray  Strep screen is negative. Given the time of year, the duration of symptoms, and the type of symptoms she is having, I believe that the patient is dealing with postnasal drip due to sinus irritation and allergies. Differential diagnosis also includes chronic sinusitis. However her exam is unremarkable and therefore I believe this is more likely due to allergies. Begin Flonase 2 sprays each nostril daily coupled with  Claritin 10 mg over-the-counter daily. Recheck in one week if no better or sooner if worse. Consider a short taper prednisone if no better. If the patient develops fever she is to call me immediately as that would be a sign of sinusitis due to a secondary bacterial infection

## 2015-08-05 LAB — CULTURE, GROUP A STREP: Organism ID, Bacteria: NORMAL

## 2015-08-11 ENCOUNTER — Encounter: Payer: Self-pay | Admitting: Family Medicine

## 2015-11-26 ENCOUNTER — Other Ambulatory Visit: Payer: Self-pay | Admitting: Gynecology

## 2015-11-26 DIAGNOSIS — Z1231 Encounter for screening mammogram for malignant neoplasm of breast: Secondary | ICD-10-CM

## 2016-01-04 ENCOUNTER — Ambulatory Visit: Payer: Medicare Other

## 2016-01-07 ENCOUNTER — Ambulatory Visit
Admission: RE | Admit: 2016-01-07 | Discharge: 2016-01-07 | Disposition: A | Discharge status: Home / Self Care | Payer: Medicare Other | Source: Ambulatory Visit | Attending: Gynecology | Admitting: Gynecology

## 2016-01-07 DIAGNOSIS — B078 Other viral warts: Secondary | ICD-10-CM | POA: Diagnosis not present

## 2016-01-07 DIAGNOSIS — T1490 Injury, unspecified: Secondary | ICD-10-CM | POA: Diagnosis not present

## 2016-01-07 DIAGNOSIS — Z1231 Encounter for screening mammogram for malignant neoplasm of breast: Secondary | ICD-10-CM

## 2016-01-07 DIAGNOSIS — Z1283 Encounter for screening for malignant neoplasm of skin: Secondary | ICD-10-CM | POA: Diagnosis not present

## 2016-01-07 DIAGNOSIS — L821 Other seborrheic keratosis: Secondary | ICD-10-CM | POA: Diagnosis not present

## 2016-01-19 ENCOUNTER — Ambulatory Visit (INDEPENDENT_AMBULATORY_CARE_PROVIDER_SITE_OTHER): Payer: Medicare Other

## 2016-01-19 DIAGNOSIS — Z23 Encounter for immunization: Secondary | ICD-10-CM | POA: Diagnosis not present

## 2016-01-21 ENCOUNTER — Encounter: Payer: Self-pay | Admitting: Gynecology

## 2016-01-21 ENCOUNTER — Ambulatory Visit (INDEPENDENT_AMBULATORY_CARE_PROVIDER_SITE_OTHER): Payer: Medicare Other | Admitting: Gynecology

## 2016-01-21 VITALS — BP 120/74 | Ht 65.0 in | Wt 150.0 lb

## 2016-01-21 DIAGNOSIS — N952 Postmenopausal atrophic vaginitis: Secondary | ICD-10-CM

## 2016-01-21 DIAGNOSIS — Z9189 Other specified personal risk factors, not elsewhere classified: Secondary | ICD-10-CM | POA: Diagnosis not present

## 2016-01-21 DIAGNOSIS — Z79890 Hormone replacement therapy (postmenopausal): Secondary | ICD-10-CM | POA: Diagnosis not present

## 2016-01-21 DIAGNOSIS — Z01419 Encounter for gynecological examination (general) (routine) without abnormal findings: Secondary | ICD-10-CM

## 2016-01-21 DIAGNOSIS — M8580 Other specified disorders of bone density and structure, unspecified site: Secondary | ICD-10-CM | POA: Diagnosis not present

## 2016-01-21 NOTE — Progress Notes (Signed)
    Natalie Browning 03/01/1946 [id number]        70 y.o.  G0P0  for breast and pelvic exam. Several issues noted below.  Past medical history,surgical history, problem list, medications, allergies, family history and social history were all reviewed and documented as reviewed in the EPIC chart.  ROS:  Performed with pertinent positives and negatives included in the history, assessment and plan.   Additional significant findings :  None   Exam: [name] assistant Vitals:   01/21/16 0854  BP: 120/74  Weight: 150 lb (68 kg)  Height: 5\' 5"  (1.651 m)   Body mass index is 24.96 kg/m.  General appearance:  Normal affect, orientation and appearance. Skin: Grossly normal HEENT: Without gross lesions.  No cervical or supraclavicular adenopathy. Thyroid normal.  Lungs:  Clear without wheezing, rales or rhonchi Cardiac: RR, without RMG Abdominal:  Soft, nontender, without masses, guarding, rebound, organomegaly or hernia Breasts:  Examined lying and sitting without masses, retractions, discharge or axillary adenopathy. Pelvic:  Ext, BUS, Vagina with atrophic changes  Cervix with atrophic changes  Uterus anteverted, normal size, shape and contour, midline and mobile nontender   Adnexa without masses or tenderness    Anus and perineum normal   Rectovaginal normal sphincter tone without palpated masses or tenderness.    Assessment/Plan:  70 y.o. G0P0 female for breast and pelvic exam  1. Postmenopausal/atrophic genital changes/HRT. Patient continues on formulated estrogen cream and Prometrium 200 mg the first 12 days of each month. No withdrawal bleeding. I reviewed the most current NAMS 2017 HRT guidelines to include benefits as far as symptom relief and possible cardiovascular and bone health benefits when started early versus risks to include thrombosis such as stroke heart attack DVT and breast cancer. Benefits of transdermal estrogen absorption from a thrombosis standpoint reviewed.  Patient feels very strongly that she wants to continue. I reviewed alternatives to her current regimen to include switching her Prometrium to 100 mg nightly. Patient is not interested in changing and prefers to continue as is. She knows to call me if she does any vaginal bleeding. 2. Osteopenia. DEXA 2015 T score -2 FRAX 15%/2.8%. Stable from prior DEXA 2012. Recommend follow up DEXA now at 2 year interval and she agrees to schedule this. 3. Mammography 12/2015. Continue with annual mammography when due. SBE monthly reviewed. 4. Colonoscopy 2012. Repeat at their recommended interval. 5. Pap smear 2015. No Pap smear done today. History of cryosurgery in the past number of years ago with normal Pap smears since then. Discussed options to stop screening per current screening guidelines based on age versus less frequent screening intervals. Will readdress on an annual basis. 6. Health maintenance. No routine lab work done as patient reports this done elsewhere. Follow up for bone density. Follow up in one year, sooner as needed.  10 minutes of my time in excess of her breast and pelvic exam was spent in direct face to face counseling and coordination of care in regards to her HRT, review of new guidelines and different treatment options.    Anastasio Auerbach MD, 9:17 AM 01/21/2016

## 2016-01-21 NOTE — Patient Instructions (Signed)
Follow up for the bone density as scheduled.  Call if you have any issues with your hormone replacement therapy or you do any vaginal bleeding.

## 2016-02-15 DIAGNOSIS — H04123 Dry eye syndrome of bilateral lacrimal glands: Secondary | ICD-10-CM | POA: Diagnosis not present

## 2016-02-15 DIAGNOSIS — H43813 Vitreous degeneration, bilateral: Secondary | ICD-10-CM | POA: Diagnosis not present

## 2016-02-15 DIAGNOSIS — Z961 Presence of intraocular lens: Secondary | ICD-10-CM | POA: Diagnosis not present

## 2016-02-16 ENCOUNTER — Encounter: Payer: Self-pay | Admitting: Family Medicine

## 2016-02-16 ENCOUNTER — Ambulatory Visit (INDEPENDENT_AMBULATORY_CARE_PROVIDER_SITE_OTHER): Payer: Medicare Other | Admitting: Family Medicine

## 2016-02-16 VITALS — BP 132/80 | HR 60 | Temp 97.9°F | Resp 18 | Wt 152.0 lb

## 2016-02-16 DIAGNOSIS — M25512 Pain in left shoulder: Secondary | ICD-10-CM | POA: Diagnosis not present

## 2016-02-16 NOTE — Progress Notes (Signed)
   Subjective:    Patient ID: Natalie Browning, female    DOB: [birth year], 70 y.o.   MRN: [id number]  HPI 1 month of pain in left shoulder with abduction greater than 85 degrees.  No pain with internal external rotation.  No specific injury.  Positive empty can.  Negative Hawkins.   Past Medical History:  Diagnosis Date  . Acid reflux   . Allergy   . Anemia   . Cataract   . Cervical dysplasia 1970's  . Osteopenia 09/2013   T score -2.0 FRAX 15%/2.8% overall stable from prior DEXA 2012   Past Surgical History:  Procedure Laterality Date  . cataract surg    . COLPOSCOPY    . DILATION AND CURETTAGE OF UTERUS  03/18/2010   Endometrial polyp  . EYE SURGERY     Laser  . GYNECOLOGIC CRYOSURGERY    . NASAL SINUS SURGERY    . TONSILLECTOMY    . UPPER GASTROINTESTINAL ENDOSCOPY  2006   Current Outpatient Prescriptions on File Prior to Visit  Medication Sig Dispense Refill  . Calcium Carbonate-Vitamin D (CALCIUM + D PO) Take by mouth. Reported on 07/15/2015    . fluticasone (FLONASE) 50 MCG/ACT nasal spray Place 2 sprays into both nostrils daily. 16 g 6  . NONFORMULARY OR COMPOUNDED ITEM Triest 1.25 mg (formulated estrogen cream) daily  1 ml once daily 90 each 3  . NONFORMULARY OR COMPOUNDED ITEM compound bio-identical progesterone 200 mg 1 po daily day 1-12 monthly 12 each 5  . Omega-3 Fatty Acids (FISH OIL PO) Take by mouth.    . RESTASIS 0.05 % ophthalmic emulsion      No current facility-administered medications on file prior to visit.    Allergies  Allergen Reactions  . Ciprofloxacin   . Codeine Itching and Nausea Only  . Macrobid [Nitrofurantoin] Nausea And Vomiting  . Simethicone Nausea Only   Social History   Social History  . Marital status: Married    Spouse name: N/A  . Number of children: N/A  . Years of education: N/A   Occupational History  . Not on file.   Social History Main Topics  . Smoking status: Never Smoker  . Smokeless tobacco: Never Used  . Alcohol  use No  . Drug use: No  . Sexual activity: Not Currently    Birth control/ protection: Post-menopausal     Comment: 1st intercourse 73 yo-1 partner   Other Topics Concern  . Not on file   Social History Narrative  . No narrative on file      Review of Systems  All other systems reviewed and are negative.      Objective:   Physical Exam  Neck: Neck supple.  Cardiovascular: Normal rate, regular rhythm and normal heart sounds.   Pulmonary/Chest: Effort normal and breath sounds normal. No respiratory distress. She has no wheezes. She has no rales.  Musculoskeletal:       Left shoulder: She exhibits decreased range of motion, tenderness and pain. She exhibits no bony tenderness, no effusion, no crepitus, no spasm and normal strength.  Vitals reviewed.         Assessment & Plan:  Acute pain of left shoulder suspect impingement syndrome.  Injected left subacromial space with 2 cc of 40 mg/mL kenalog using sterile technique without complications.

## 2016-03-17 ENCOUNTER — Ambulatory Visit (INDEPENDENT_AMBULATORY_CARE_PROVIDER_SITE_OTHER): Payer: Medicare Other | Admitting: Physician Assistant

## 2016-03-17 ENCOUNTER — Encounter: Payer: Self-pay | Admitting: Physician Assistant

## 2016-03-17 VITALS — BP 130/82 | HR 67 | Temp 98.1°F | Resp 16 | Wt 147.0 lb

## 2016-03-17 DIAGNOSIS — B9689 Other specified bacterial agents as the cause of diseases classified elsewhere: Principal | ICD-10-CM

## 2016-03-17 DIAGNOSIS — J988 Other specified respiratory disorders: Secondary | ICD-10-CM

## 2016-03-17 MED ORDER — AMOXICILLIN-POT CLAVULANATE 875-125 MG PO TABS: 1.0000 | ORAL_TABLET | Freq: Two times a day (BID) | ORAL | 0 refills | Status: DC

## 2016-03-17 NOTE — Progress Notes (Signed)
Patient ID: Natalie Browning MRN: [id number], DOB: 28-Mar-1946, 70 y.o. Date of Encounter: 03/17/2016, 10:17 AM    Chief Complaint:  Chief Complaint  Patient presents with  . Headache  . congested  . Cough    drainage x 86mos     HPI: 70 y.o. year old female presents with above.  Says that she started having drainage November 3. Says that she remembers that date because she has had to miss the craft fair because of the symptoms. Since then she has used multiple over-the-counter medicines including Netty pot. She had for a right frontal sinus pain. For a while she had some chest congestion and used Mucinex. Hasn't been able to get much sleep at night because of all of these symptoms. Now she is back to square one". Says that her head is stuffy she is getting out yellow mucus. Now taking Alka-Seltzer plus. Says now it is all in her head. She is not having any chest congestion now. Has used NyQuil trying to get sleep. Has had no fevers or chills. No sore throat or ear ache.     Home Meds:   Outpatient Medications Prior to Visit  Medication Sig Dispense Refill  . Calcium Carbonate-Vitamin D (CALCIUM + D PO) Take by mouth. Reported on 07/15/2015    . NONFORMULARY OR COMPOUNDED ITEM Triest 1.25 mg (formulated estrogen cream) daily  1 ml once daily 90 each 3  . NONFORMULARY OR COMPOUNDED ITEM compound bio-identical progesterone 200 mg 1 po daily day 1-12 monthly 12 each 5  . Omega-3 Fatty Acids (FISH OIL PO) Take by mouth.    . RESTASIS 0.05 % ophthalmic emulsion     . SF 5000 PLUS 1.1 % CREA dental cream USE 1 TIME QPM  0  . fluticasone (FLONASE) 50 MCG/ACT nasal spray Place 2 sprays into both nostrils daily. (Patient not taking: Reported on 03/17/2016) 16 g 6   No facility-administered medications prior to visit.     Allergies:  Allergies  Allergen Reactions  . Ciprofloxacin   . Codeine Itching and Nausea Only  . Macrobid [Nitrofurantoin] Nausea And Vomiting  . Simethicone Nausea  Only      Review of Systems: See HPI for pertinent ROS. All other ROS negative.    Physical Exam: Blood pressure 130/82, pulse 67, temperature 98.1 F (36.7 C), temperature source Oral, resp. rate 16, weight 147 lb (66.7 kg), SpO2 99 %., Body mass index is 24.46 kg/m. General:  WNWD wF. Appears in no acute distress. HEENT: Normocephalic, atraumatic, eyes without discharge, sclera non-icteric, nares are without discharge. Bilateral auditory canals clear, TM's are without perforation, pearly grey and translucent with reflective cone of light bilaterally. Oral cavity moist, posterior pharynx without exudate, erythema, peritonsillar abscess. There is no tenderness with percussion to frontal or maxillary sinuses bilaterally.  Neck: Supple. No thyromegaly. No lymphadenopathy. Lungs: Clear bilaterally to auscultation without wheezes, rales, or rhonchi. Breathing is unlabored. Heart: Regular rhythm. No murmurs, rubs, or gallops. Msk:  Strength and tone normal for age. Extremities/Skin: Warm and dry.  Neuro: Alert and oriented X 3. Moves all extremities spontaneously. Gait is normal. CNII-XII grossly in tact. Psych:  Responds to questions appropriately with a normal affect.     ASSESSMENT AND PLAN:  70 y.o. year old female with  1. Bacterial respiratory infection She is to take the antibiotic as directed and complete all of it. Can use over-the-counter medicines for symptom relief in the interim. Follow-up if symptoms do not resolve  upon completion of antibiotic. - amoxicillin-clavulanate (AUGMENTIN) 875-125 MG tablet; Take 1 tablet by mouth 2 (two) times daily.  Dispense: 20 tablet; Refill: 0   Signed, [address] [place], [place], [facility] 03/17/2016 10:17 AM

## 2016-04-01 ENCOUNTER — Other Ambulatory Visit: Payer: Self-pay | Admitting: Gynecology

## 2016-05-17 ENCOUNTER — Other Ambulatory Visit: Payer: Self-pay | Admitting: Gynecology

## 2016-06-20 ENCOUNTER — Ambulatory Visit (INDEPENDENT_AMBULATORY_CARE_PROVIDER_SITE_OTHER): Payer: Medicare Other | Admitting: Family Medicine

## 2016-06-20 ENCOUNTER — Encounter: Payer: Self-pay | Admitting: Family Medicine

## 2016-06-20 VITALS — BP 128/62 | HR 70 | Temp 98.6°F | Resp 18 | Ht 65.0 in | Wt 152.0 lb

## 2016-06-20 DIAGNOSIS — H1032 Unspecified acute conjunctivitis, left eye: Secondary | ICD-10-CM | POA: Diagnosis not present

## 2016-06-20 DIAGNOSIS — J069 Acute upper respiratory infection, unspecified: Secondary | ICD-10-CM

## 2016-06-20 DIAGNOSIS — J0110 Acute frontal sinusitis, unspecified: Secondary | ICD-10-CM

## 2016-06-20 MED ORDER — AZITHROMYCIN 250 MG PO TABS: ORAL_TABLET | ORAL | 0 refills | Status: DC

## 2016-06-20 NOTE — Patient Instructions (Addendum)
Left eye- use drop until Wed Right eye- finish drop today  Take antibiotics  F/U as needed

## 2016-06-20 NOTE — Progress Notes (Signed)
   Subjective:    Patient ID: Natalie Browning, female    DOB: 04-09-1946, 71 y.o.   MRN: [id number]  Patient presents for Illness (x2 weeks- fever (T max 102), productive cough, head cold)   Pt here with fever ,fatigue last Sunday/Monday, Tmax close to 102F. Thursday starting draining from sinuses ( used flonase) had some redness in left eye which worsened started using polytrim. Sunday had matting and had green drainage. Eye looks better this morning. Yesterday started with more productive cough. Took Nyquil last night.    Review Of Systems:  GEN- denies fatigue, fever, weight loss,weakness, recent illness HEENT- denies eye drainage, change in vision, nasal discharge, CVS- denies chest pain, palpitations RESP- denies SOB, cough, wheeze ABD- denies N/V, change in stools, abd pain GU- denies dysuria, hematuria, dribbling, incontinence MSK- denies joint pain, muscle aches, injury Neuro- denies headache, dizziness, syncope, seizure activity       Objective:    BP 128/62   Pulse 70   Temp 98.6 F (37 C) (Oral)   Resp 18   Ht 5\' 5"  (1.651 m)   Wt 152 lb (68.9 kg)   SpO2 100%   BMI 25.29 kg/m    GEN- NAD, alert and oriented x3 HEENT- PERRL, EOMI, non injected sclera, injected left conjunctiva/sclera , + drainage ,  MMM, oropharynx clear  TM clear bilat no effusion,  + frontal sinus tenderness, inflammed turbinates,  Nasal drainage  Neck- Supple, shotty  LAD CVS- RRR, no murmur RESP-CTAB EXT- No edema Pulses- Radial 2+        Assessment & Plan:      Problem List Items Addressed This Visit    None    Visit Diagnoses    Acute bacterial conjunctivitis of left eye    -  Primary   Polytrim for 5 days total to left eye   Relevant Medications   azithromycin (ZITHROMAX) 250 MG tablet   Acute frontal sinusitis, recurrence not specified       zpak for uri sinusitis, use nasal rinse, robitussin DM   Relevant Medications   azithromycin (ZITHROMAX) 250 MG tablet   Upper  respiratory tract infection, unspecified type       Relevant Medications   azithromycin (ZITHROMAX) 250 MG tablet      Note: This dictation was prepared with Dragon dictation along with smaller phrase technology. Any transcriptional errors that result from this process are unintentional.

## 2016-07-04 ENCOUNTER — Encounter: Payer: Self-pay | Admitting: Family Medicine

## 2016-07-04 ENCOUNTER — Ambulatory Visit (INDEPENDENT_AMBULATORY_CARE_PROVIDER_SITE_OTHER): Payer: Medicare Other | Admitting: Family Medicine

## 2016-07-04 VITALS — BP 110/74 | HR 78 | Temp 98.2°F | Resp 16 | Ht 65.5 in | Wt 152.0 lb

## 2016-07-04 DIAGNOSIS — J329 Chronic sinusitis, unspecified: Secondary | ICD-10-CM

## 2016-07-04 MED ORDER — CETIRIZINE HCL 10 MG PO TABS: 10.0000 mg | ORAL_TABLET | Freq: Every day | ORAL | 11 refills | Status: DC

## 2016-07-04 MED ORDER — FLUTICASONE PROPIONATE 50 MCG/ACT NA SUSP: 2.0000 | Freq: Every day | NASAL | 6 refills | Status: DC

## 2016-07-04 MED ORDER — BENZONATATE 100 MG PO CAPS: 200.0000 mg | ORAL_CAPSULE | Freq: Three times a day (TID) | ORAL | 0 refills | Status: DC | PRN

## 2016-07-04 NOTE — Progress Notes (Signed)
Subjective:    Patient ID: Natalie Browning, female    DOB: 06/28/1945, 71 y.o.   MRN: [id number]  HPI Patient states that she has been sick ever since November. She has been treated with erythromycin, another antibiotic that she cannot recall the name of, and she is taken Polytrim eyedrops for conjunctivitis on 2 separate occasions. She is here today reporting a cough on a daily basis due to postnasal drip, chronic rhinorrhea. She also reports green mucus that she blows out of her nose and occasionally coughs up. She denies any sinus pain or sinus pressure. The cough is more of a tickle cough that is worse when she lies down at night. On examination today, the right eye has a slight erythematous tinge to the inferior portion of her conjunctiva. It is very mild and hard to notice Past Medical History:  Diagnosis Date  . Acid reflux   . Allergy   . Anemia   . Cataract   . Cervical dysplasia 1970's  . Osteopenia 09/2013   T score -2.0 FRAX 15%/2.8% overall stable from prior DEXA 2012   Past Surgical History:  Procedure Laterality Date  . cataract surg    . COLPOSCOPY    . DILATION AND CURETTAGE OF UTERUS  03/18/2010   Endometrial polyp  . EYE SURGERY     Laser  . GYNECOLOGIC CRYOSURGERY    . NASAL SINUS SURGERY    . TONSILLECTOMY    . UPPER GASTROINTESTINAL ENDOSCOPY  2006   Current Outpatient Prescriptions on File Prior to Visit  Medication Sig Dispense Refill  . azithromycin (ZITHROMAX) 250 MG tablet Take 2 tablets x 1 day, then 1 tab daily for 4 days 6 tablet 0  . Calcium Carbonate-Vitamin D (CALCIUM + D PO) Take by mouth. Reported on 07/15/2015    . Estriol POWD APPLY 1ML (4 CLICKS) ONCE A DAY. 30 g PRN  . fluticasone (FLONASE) 50 MCG/ACT nasal spray Place 2 sprays into both nostrils daily. 16 g 6  . NONFORMULARY OR COMPOUNDED ITEM Triest 1.25 mg (formulated estrogen cream) daily  1 ml once daily 90 each 3  . NONFORMULARY OR COMPOUNDED ITEM compound bio-identical progesterone 200  mg 1 po daily day 1-12 monthly 12 each 5  . Omega-3 Fatty Acids (FISH OIL PO) Take by mouth.    . Progesterone Micronized (PROGESTERONE, BULK,) POWD TAKE 1 CAPSULE EVERY DAY ON DAYS 1-12. 12 each 9  . RESTASIS 0.05 % ophthalmic emulsion     . SF 5000 PLUS 1.1 % CREA dental cream USE 1 TIME QPM  0   No current facility-administered medications on file prior to visit.    Allergies  Allergen Reactions  . Ciprofloxacin   . Codeine Itching and Nausea Only  . Macrobid [Nitrofurantoin] Nausea And Vomiting  . Simethicone Nausea Only   Social History   Social History  . Marital status: Married    Spouse name: N/A  . Number of children: N/A  . Years of education: N/A   Occupational History  . Not on file.   Social History Main Topics  . Smoking status: Never Smoker  . Smokeless tobacco: Never Used  . Alcohol use No  . Drug use: No  . Sexual activity: Not Currently    Birth control/ protection: Post-menopausal     Comment: 1st intercourse 24 yo-1 partner   Other Topics Concern  . Not on file   Social History Narrative  . No narrative on file  Review of Systems  All other systems reviewed and are negative.      Objective:   Physical Exam  HENT:  Right Ear: Tympanic membrane, external ear and ear canal normal.  Left Ear: Tympanic membrane, external ear and ear canal normal.  Nose: Mucosal edema and rhinorrhea present. Right sinus exhibits no maxillary sinus tenderness and no frontal sinus tenderness. Left sinus exhibits no maxillary sinus tenderness and no frontal sinus tenderness.  Mouth/Throat: Oropharynx is clear and moist. No oropharyngeal exudate.  Eyes: Right conjunctiva is injected.    Neck: Neck supple.  Cardiovascular: Normal rate, regular rhythm and normal heart sounds.   Pulmonary/Chest: Effort normal and breath sounds normal. No respiratory distress. She has no wheezes. She has no rales.  Lymphadenopathy:    She has no cervical adenopathy.  Vitals  reviewed.         Assessment & Plan:  Chronic sinusitis, unspecified location - Plan: fluticasone (FLONASE) 50 MCG/ACT nasal spray, cetirizine (ZYRTEC) 10 MG tablet, DISCONTINUED: cetirizine (ZYRTEC) 10 MG tablet, DISCONTINUED: fluticasone (FLONASE) 50 MCG/ACT nasal spray  Patient is been treated with numerous antibiotics as well as anabolic eyedrops. However symptoms persist and have lasted since November. Therefore she has almost 4 months of rhinorrhea, postnasal drip, sinus pressure, and occasional itchy red eyes. I believe she could have chronic sinusitis likely due to allergies. I recommended trying Flonase 2 sprays each nostril daily and Zyrtec 10 mg a day and recheck in 10 days.

## 2017-01-26 DIAGNOSIS — Z23 Encounter for immunization: Secondary | ICD-10-CM | POA: Diagnosis not present

## 2017-02-17 DIAGNOSIS — Z961 Presence of intraocular lens: Secondary | ICD-10-CM | POA: Diagnosis not present

## 2017-02-17 DIAGNOSIS — H04123 Dry eye syndrome of bilateral lacrimal glands: Secondary | ICD-10-CM | POA: Diagnosis not present

## 2017-05-24 ENCOUNTER — Other Ambulatory Visit: Payer: Self-pay | Admitting: Gynecology

## 2017-05-25 ENCOUNTER — Other Ambulatory Visit: Payer: Self-pay

## 2017-05-25 ENCOUNTER — Other Ambulatory Visit: Payer: Self-pay | Admitting: Gynecology

## 2017-05-25 DIAGNOSIS — Z1231 Encounter for screening mammogram for malignant neoplasm of breast: Secondary | ICD-10-CM

## 2017-05-25 NOTE — Telephone Encounter (Signed)
Okay to refill through March

## 2017-05-25 NOTE — Telephone Encounter (Signed)
Last CE 01/21/2016.  She is scheduled for 06/16/17 with Dr. Loetta Rough.  She asked for refills.

## 2017-05-26 MED ORDER — NONFORMULARY OR COMPOUNDED ITEM: 0 refills | Status: DC

## 2017-05-26 NOTE — Telephone Encounter (Signed)
Rx called in 

## 2017-06-16 ENCOUNTER — Ambulatory Visit
Admission: RE | Admit: 2017-06-16 | Discharge: 2017-06-16 | Disposition: A | Discharge status: Home / Self Care | Payer: Medicare Other | Source: Ambulatory Visit | Attending: Gynecology | Admitting: Gynecology

## 2017-06-16 DIAGNOSIS — Z1231 Encounter for screening mammogram for malignant neoplasm of breast: Secondary | ICD-10-CM

## 2017-06-22 ENCOUNTER — Encounter: Payer: Self-pay | Admitting: Family Medicine

## 2017-06-22 ENCOUNTER — Ambulatory Visit (INDEPENDENT_AMBULATORY_CARE_PROVIDER_SITE_OTHER): Payer: Medicare Other | Admitting: Family Medicine

## 2017-06-22 VITALS — BP 134/72 | HR 64 | Temp 98.2°F | Resp 14 | Ht 65.5 in | Wt 148.0 lb

## 2017-06-22 DIAGNOSIS — G44209 Tension-type headache, unspecified, not intractable: Secondary | ICD-10-CM

## 2017-06-22 MED ORDER — DICLOFENAC SODIUM 75 MG PO TBEC: 75.0000 mg | DELAYED_RELEASE_TABLET | Freq: Two times a day (BID) | ORAL | 0 refills | Status: DC

## 2017-06-22 MED ORDER — TIZANIDINE HCL 4 MG PO TABS: 4.0000 mg | ORAL_TABLET | Freq: Four times a day (QID) | ORAL | 0 refills | Status: DC | PRN

## 2017-06-22 NOTE — Progress Notes (Signed)
Subjective:    Patient ID: Natalie Browning, female    DOB: Feb 06, 1946, 72 y.o.   MRN: [id number]  HPI  Patient presents with a headache. Headache is located in her right occiput. It is been off and on for more than a week. She's been under more stress recently caring for her nephew who has been diagnosed with a brain tumor and recently had neurosurgery. Headache began shortly after that.  Around the same time, she had a viral upper respiratory infection. Upper respiratory infection resolved 1 week ago. However the headache soon followed. She denies any pain in her frontal or maxillary sinuses. She denies any rhinorrhea. She denies any cough or sore throat. She denies any meningismus. Headache radiates from her right occiput along her right temple. She denies any tenderness in the right temporal artery. She denies any photophobia or phonophobia. She denies any neurologic deficit. She denies any numbness or tingling in her arms or her legs. She denies any weakness in her extremities. She denies any autonomic symptoms. She denies any lacrimation or rhinorrhea Past Medical History:  Diagnosis Date  . Acid reflux   . Allergy   . Anemia   . Cataract   . Cervical dysplasia 1970's  . Osteopenia 09/2013   T score -2.0 FRAX 15%/2.8% overall stable from prior DEXA 2012   Past Surgical History:  Procedure Laterality Date  . cataract surg    . COLPOSCOPY    . DILATION AND CURETTAGE OF UTERUS  03/18/2010   Endometrial polyp  . EYE SURGERY     Laser  . GYNECOLOGIC CRYOSURGERY    . NASAL SINUS SURGERY    . TONSILLECTOMY    . UPPER GASTROINTESTINAL ENDOSCOPY  2006   Current Outpatient Medications on File Prior to Visit  Medication Sig Dispense Refill  . benzonatate (TESSALON PERLES) 100 MG capsule Take 2 capsules (200 mg total) by mouth 3 (three) times daily as needed for cough. 20 capsule 0  . Calcium Carbonate-Vitamin D (CALCIUM + D PO) Take by mouth. Reported on 07/15/2015    . NONFORMULARY OR  COMPOUNDED ITEM compound bio-identical progesterone 200 mg 1 po daily day 1-12 monthly 36 each 0  . NONFORMULARY OR COMPOUNDED ITEM Triest 1.25 1 ml daily (formulated estrogen cream) 90 each 0  . Omega-3 Fatty Acids (FISH OIL PO) Take by mouth.    . RESTASIS 0.05 % ophthalmic emulsion     . cetirizine (ZYRTEC) 10 MG tablet Take 1 tablet (10 mg total) by mouth daily. (Patient not taking: Reported on 06/22/2017) 30 tablet 11   No current facility-administered medications on file prior to visit.    Allergies  Allergen Reactions  . Ciprofloxacin   . Codeine Itching and Nausea Only  . Macrobid [Nitrofurantoin] Nausea And Vomiting  . Simethicone Nausea Only   Social History   Socioeconomic History  . Marital status: Married    Spouse name: Not on file  . Number of children: Not on file  . Years of education: Not on file  . Highest education level: Not on file  Social Needs  . Financial resource strain: Not on file  . Food insecurity - worry: Not on file  . Food insecurity - inability: Not on file  . Transportation needs - medical: Not on file  . Transportation needs - non-medical: Not on file  Occupational History  . Not on file  Tobacco Use  . Smoking status: Never Smoker  . Smokeless tobacco: Never Used  Substance and  Sexual Activity  . Alcohol use: No    Alcohol/week: 0.0 oz  . Drug use: No  . Sexual activity: Not Currently    Birth control/protection: Post-menopausal    Comment: 1st intercourse 71 yo-1 partner  Other Topics Concern  . Not on file  Social History Narrative  . Not on file      Review of Systems  All other systems reviewed and are negative.      Objective:   Physical Exam  Constitutional: She is oriented to person, place, and time. She appears well-developed and well-nourished. No distress.  HENT:  Right Ear: External ear normal.  Left Ear: External ear normal.  Nose: Nose normal.  Mouth/Throat: Oropharynx is clear and moist. No oropharyngeal  exudate.  Eyes: Conjunctivae and EOM are normal. Pupils are equal, round, and reactive to light. Right eye exhibits no discharge. Left eye exhibits no discharge.  Neck: Neck supple.  Cardiovascular: Normal rate, regular rhythm and normal heart sounds.  Pulmonary/Chest: Effort normal and breath sounds normal.  Lymphadenopathy:    She has no cervical adenopathy.  Neurological: She is alert and oriented to person, place, and time. She has normal reflexes. She displays normal reflexes. No cranial nerve deficit. She exhibits normal muscle tone. Coordination normal.  Skin: She is not diaphoretic.  Vitals reviewed.         Assessment & Plan:  Acute non intractable tension-type headache  I believe the patient is experiencing a tension headache. I have recommended diclofenac 75 mg by mouth twice a day for [redacted] week along with Zanaflex 4 mg by mouth daily at bedtime. Reassess in one week if headache persists or sooner if worsening or changing in any way.

## 2017-07-17 ENCOUNTER — Ambulatory Visit (INDEPENDENT_AMBULATORY_CARE_PROVIDER_SITE_OTHER): Payer: Medicare Other | Admitting: Gynecology

## 2017-07-17 ENCOUNTER — Encounter: Payer: Self-pay | Admitting: Gynecology

## 2017-07-17 VITALS — BP 122/78 | Ht 64.0 in | Wt 150.0 lb

## 2017-07-17 DIAGNOSIS — M8580 Other specified disorders of bone density and structure, unspecified site: Secondary | ICD-10-CM

## 2017-07-17 DIAGNOSIS — Z124 Encounter for screening for malignant neoplasm of cervix: Secondary | ICD-10-CM

## 2017-07-17 DIAGNOSIS — Z01411 Encounter for gynecological examination (general) (routine) with abnormal findings: Secondary | ICD-10-CM | POA: Diagnosis not present

## 2017-07-17 DIAGNOSIS — Z79890 Hormone replacement therapy (postmenopausal): Secondary | ICD-10-CM

## 2017-07-17 DIAGNOSIS — N952 Postmenopausal atrophic vaginitis: Secondary | ICD-10-CM

## 2017-07-17 NOTE — Addendum Note (Signed)
Addended by: Nelva Nay on: 07/17/2017 03:32 PM   Modules accepted: Orders

## 2017-07-17 NOTE — Patient Instructions (Signed)
Followup for bone density as scheduled. 

## 2017-07-17 NOTE — Progress Notes (Signed)
    Natalie Browning [birth year] [id number]        72 y.o.  G0P0 for breast and pelvic exam.  Several issues noted below.  Past medical history,surgical history, problem list, medications, allergies, family history and social history were all reviewed and documented as reviewed in the EPIC chart.  ROS:  Performed with pertinent positives and negatives included in the history, assessment and plan.   Additional significant findings : None   Exam: Natalie Browning assistant Vitals:   07/17/17 1437  BP: 122/78  Weight: 150 lb (68 kg)  Height: 5\' 4"  (1.626 m)   Body mass index is 25.75 kg/m.  General appearance:  Normal affect, orientation and appearance. Skin: Grossly normal HEENT: Without gross lesions.  No cervical or supraclavicular adenopathy. Thyroid normal.  Lungs:  Clear without wheezing, rales or rhonchi Cardiac: RR, without RMG Abdominal:  Soft, nontender, without masses, guarding, rebound, organomegaly or hernia Breasts:  Examined lying and sitting without masses, retractions, discharge or axillary adenopathy. Pelvic:  Ext, BUS, Vagina: With atrophic changes  Cervix: With atrophic changes.  Pap smear done  Uterus: Anteverted, normal size, shape and contour, midline and mobile nontender   Adnexa: Without masses or tenderness    Anus and perineum: Normal   Rectovaginal: Normal sphincter tone without palpated masses or tenderness.    Assessment/Plan:  72 y.o. G0P0 female for annual gynecologic exam.   1. Postmenopausal/atrophic genital changes/HRT.  Patient continues on Triest formulated estrogen cream and Prometrium 200 mg for the first 12 days of each month.  Patient does note that she has decreased her estrogen use to 2 pumps daily instead of 4 and her Prometrium to 10 days monthly instead of 12.  Anything less she has hot flushes and sweats.  We again reviewed the issues of HRT, risks versus benefits.  Risks to include thrombosis such as stroke heart attack DVT and breast cancer  issue was discussed.  Benefits to include symptom relief as well as cardiovascular and bone health when started early as in her case also discussed.  At this point the patient wants to continue.  Of asked her not to go below 10 days of Prometrium for endometrial protection no matter how low she goes on the estrogen dose.  She has had no bleeding at the end of the Prometrium or any other time.  We discussed in the past about switching to different regimens but she is comfortable with this regimen and prefers to continue.  Refill times 1 year provided. 2. Osteopenia.  DEXA 2015 T score -2 FRAX 15% / 2.8%.  Stable from prior DEXA 2012.  She was to schedule this last year but it was not done.  I stress the need to schedule this year and she agrees to do so. 3. Mammography 06/2017.  Continue with annual mammography next year.  Breast exam normal today. 4. Pap smear 2015.  Pap smear done today.  History of cryosurgery in the past a number of years ago with normal Pap smears since.  Options to stop screening versus less frequent screening intervals reviewed. 5. Colonoscopy 2012.  Repeat at their recommended interval. 6. Health maintenance.  No routine lab work done as patient does this elsewhere.  Follow-up for bone density.  Follow-up in 1 year for annual exam.   Anastasio Auerbach MD, 3:12 PM 07/17/2017

## 2017-07-18 ENCOUNTER — Telehealth: Payer: Self-pay | Admitting: *Deleted

## 2017-07-18 LAB — PAP IG W/ RFLX HPV ASCU

## 2017-07-18 MED ORDER — NONFORMULARY OR COMPOUNDED ITEM: 4 refills | Status: DC

## 2017-07-18 NOTE — Telephone Encounter (Signed)
-----   Message from Anastasio Auerbach, MD sent at 07/17/2017  3:17 PM EDT ----- Call in refills to [facility] for the patient's HRT to include both the Triest and Prometrium times 1 year at the current directions

## 2017-07-18 NOTE — Telephone Encounter (Signed)
Rx called in 

## 2017-08-15 ENCOUNTER — Other Ambulatory Visit: Payer: Self-pay | Admitting: Gynecology

## 2017-08-15 ENCOUNTER — Ambulatory Visit (INDEPENDENT_AMBULATORY_CARE_PROVIDER_SITE_OTHER): Payer: Medicare Other

## 2017-08-15 DIAGNOSIS — M8589 Other specified disorders of bone density and structure, multiple sites: Secondary | ICD-10-CM

## 2017-08-15 DIAGNOSIS — Z780 Asymptomatic menopausal state: Secondary | ICD-10-CM

## 2017-08-15 DIAGNOSIS — M8580 Other specified disorders of bone density and structure, unspecified site: Secondary | ICD-10-CM

## 2017-08-17 ENCOUNTER — Telehealth: Payer: Self-pay | Admitting: Gynecology

## 2017-08-17 NOTE — Telephone Encounter (Signed)
Pt informed

## 2017-08-17 NOTE — Telephone Encounter (Signed)
Tell patient that her most recent bone density although stable from her prior study did calculate a higher risk of fracture to indicate the possible need for medication such as Fosamax.  I would recommend an office visit to discuss the results and treatment options.

## 2017-08-25 ENCOUNTER — Telehealth: Payer: Self-pay | Admitting: *Deleted

## 2017-08-25 NOTE — Telephone Encounter (Signed)
Pt called wanting results of most recent Bone Density test performed in our office.  I reviewed the chart, the patient received a call from [place] on 08/17/17 stating she needed an Office Visit to discuss medication.  I called the patient back advised her she needs an OV to discuss this and she said she wasn't able to make an apt now as she has other things going on.  She did receive the the results in the mail yesterday of her bone density.  I will put a recall in for a letter to be sent to her in case she doesn't make an appt. KW CMA

## 2017-10-06 ENCOUNTER — Encounter: Payer: Self-pay | Admitting: Family Medicine

## 2017-10-06 ENCOUNTER — Ambulatory Visit (INDEPENDENT_AMBULATORY_CARE_PROVIDER_SITE_OTHER): Payer: Medicare Other | Admitting: Family Medicine

## 2017-10-06 VITALS — BP 110/62 | HR 68 | Temp 98.2°F | Resp 16 | Ht 65.5 in | Wt 150.0 lb

## 2017-10-06 DIAGNOSIS — Z1283 Encounter for screening for malignant neoplasm of skin: Secondary | ICD-10-CM | POA: Diagnosis not present

## 2017-10-06 DIAGNOSIS — L814 Other melanin hyperpigmentation: Secondary | ICD-10-CM | POA: Diagnosis not present

## 2017-10-06 DIAGNOSIS — L570 Actinic keratosis: Secondary | ICD-10-CM | POA: Diagnosis not present

## 2017-10-06 DIAGNOSIS — L989 Disorder of the skin and subcutaneous tissue, unspecified: Secondary | ICD-10-CM | POA: Diagnosis not present

## 2017-10-06 NOTE — Progress Notes (Signed)
Subjective:    Patient ID: Natalie Browning, female    DOB: December 22, 1945, 72 y.o.   MRN: [id number]  HPI Patient has a suspicious lesion on the dorsum of her left forearm.  She states that his size over the last 2 weeks.  The lesion is elliptical in shape.  Long axis is 1.5 cm.  Short axis is 6 to 7 mm.  Lesion consists of a central macule with white scale consistent with an actinic keratoses versus squamous cell carcinoma.  Surrounding this is purpura which I believe may be due to manipulation and scratching at the lesion over the last week and seems to be due to trauma.  However this is not certain. Past Medical History:  Diagnosis Date  . Acid reflux   . Allergy   . Anemia   . Cataract   . Cervical dysplasia 1970's  . Osteopenia 09/2013   T score -2.0 FRAX 15%/2.8% overall stable from prior DEXA 2012   Past Surgical History:  Procedure Laterality Date  . cataract surg    . COLPOSCOPY    . DILATION AND CURETTAGE OF UTERUS  03/18/2010   Endometrial polyp  . EYE SURGERY     Laser  . GYNECOLOGIC CRYOSURGERY    . NASAL SINUS SURGERY    . TONSILLECTOMY    . UPPER GASTROINTESTINAL ENDOSCOPY  2006   Current Outpatient Medications on File Prior to Visit  Medication Sig Dispense Refill  . Calcium Carbonate-Vitamin D (CALCIUM + D PO) Take by mouth. Reported on 07/15/2015    . NONFORMULARY OR COMPOUNDED ITEM compound bio-identical progesterone 200 mg 1 po daily day 1-12 monthly 36 each 4  . NONFORMULARY OR COMPOUNDED ITEM Triest 1.25 1 ml daily (formulated estrogen cream) 90 each 4  . Omega-3 Fatty Acids (FISH OIL PO) Take by mouth.    . RESTASIS 0.05 % ophthalmic emulsion     . cetirizine (ZYRTEC) 10 MG tablet Take 1 tablet (10 mg total) by mouth daily. (Patient not taking: Reported on 07/17/2017) 30 tablet 11  . diclofenac (VOLTAREN) 75 MG EC tablet Take 1 tablet (75 mg total) by mouth 2 (two) times daily. (Patient not taking: Reported on 07/17/2017) 30 tablet 0  . tiZANidine (ZANAFLEX) 4 MG  tablet Take 1 tablet (4 mg total) by mouth every 6 (six) hours as needed (headache). Take at bedtime (Patient not taking: Reported on 07/17/2017) 30 tablet 0   No current facility-administered medications on file prior to visit.    Allergies  Allergen Reactions  . Ciprofloxacin   . Codeine Itching and Nausea Only  . Macrobid [Nitrofurantoin] Nausea And Vomiting  . Simethicone Nausea Only   Social History   Socioeconomic History  . Marital status: Married    Spouse name: Not on file  . Number of children: Not on file  . Years of education: Not on file  . Highest education level: Not on file  Occupational History  . Not on file  Social Needs  . Financial resource strain: Not on file  . Food insecurity:    Worry: Not on file    Inability: Not on file  . Transportation needs:    Medical: Not on file    Non-medical: Not on file  Tobacco Use  . Smoking status: Never Smoker  . Smokeless tobacco: Never Used  Substance and Sexual Activity  . Alcohol use: No    Alcohol/week: 0.0 oz  . Drug use: No  . Sexual activity: Not Currently  Birth control/protection: Post-menopausal    Comment: 1st intercourse 19 yo-1 partner  Lifestyle  . Physical activity:    Days per week: Not on file    Minutes per session: Not on file  . Stress: Not on file  Relationships  . Social connections:    Talks on phone: Not on file    Gets together: Not on file    Attends religious service: Not on file    Active member of club or organization: Not on file    Attends meetings of clubs or organizations: Not on file    Relationship status: Not on file  . Intimate partner violence:    Fear of current or ex partner: Not on file    Emotionally abused: Not on file    Physically abused: Not on file    Forced sexual activity: Not on file  Other Topics Concern  . Not on file  Social History Narrative  . Not on file      Review of Systems  All other systems reviewed and are negative.        Objective:   Physical Exam  Cardiovascular: Normal rate, regular rhythm and normal heart sounds.  Pulmonary/Chest: Effort normal and breath sounds normal.  Skin: Lesion noted.     Vitals reviewed.         Assessment & Plan:  Skin lesion of left arm - Plan: Pathology  Skin exam, screening for cancer - Plan: Ambulatory referral to Dermatology  Patient request a referral to a dermatologist for an annual skin exam.  She has lesions in areas where she would prefer a female doctor examined her and I am happy to arrange that.  Regarding the lesion that is changing on the dorsum of her left forearm, patient agrees to a shave biopsy.  I gave her the option of cryotherapy versus a shave biopsy and she elects to shave biopsy.  Lesion was anesthetized with 0.1% lidocaine with epinephrine.  A 1 cm x 1.5 cm shave biopsy was performed and the lesion was sent to pathology in a labeled container.  Hemostasis was achieved with Drysol, Neosporin, and a Band-Aid.  Wound care was discussed.  Anticipate gradual healing over the next 7 to 10 days.  Recheck immediately if problems arise.  Await biopsy results

## 2017-10-11 LAB — PATHOLOGY

## 2017-10-11 LAB — TISSUE SPECIMEN

## 2017-11-06 ENCOUNTER — Telehealth: Payer: Self-pay

## 2017-11-06 NOTE — Telephone Encounter (Signed)
Amy with [facility] called requesting the  pathology report to be faxed over. I have faxed the report over to [phone]

## 2018-01-26 ENCOUNTER — Ambulatory Visit (INDEPENDENT_AMBULATORY_CARE_PROVIDER_SITE_OTHER): Payer: Medicare Other

## 2018-01-26 DIAGNOSIS — Z23 Encounter for immunization: Secondary | ICD-10-CM | POA: Diagnosis not present

## 2018-01-26 NOTE — Progress Notes (Signed)
Patient was in office for high dose flu vaccine.Patient received vaccine on her left deltoid.Patient tolerated well

## 2018-04-02 DIAGNOSIS — H52203 Unspecified astigmatism, bilateral: Secondary | ICD-10-CM | POA: Diagnosis not present

## 2018-04-02 DIAGNOSIS — H04123 Dry eye syndrome of bilateral lacrimal glands: Secondary | ICD-10-CM | POA: Diagnosis not present

## 2018-04-02 DIAGNOSIS — H02054 Trichiasis without entropian left upper eyelid: Secondary | ICD-10-CM | POA: Diagnosis not present

## 2018-04-02 DIAGNOSIS — H43813 Vitreous degeneration, bilateral: Secondary | ICD-10-CM | POA: Diagnosis not present

## 2018-05-01 DIAGNOSIS — L820 Inflamed seborrheic keratosis: Secondary | ICD-10-CM | POA: Diagnosis not present

## 2018-05-01 DIAGNOSIS — L814 Other melanin hyperpigmentation: Secondary | ICD-10-CM | POA: Diagnosis not present

## 2018-05-01 DIAGNOSIS — L821 Other seborrheic keratosis: Secondary | ICD-10-CM | POA: Diagnosis not present

## 2018-05-01 DIAGNOSIS — L720 Epidermal cyst: Secondary | ICD-10-CM | POA: Diagnosis not present

## 2018-05-08 DIAGNOSIS — Z1211 Encounter for screening for malignant neoplasm of colon: Secondary | ICD-10-CM | POA: Diagnosis not present

## 2018-05-08 DIAGNOSIS — R142 Eructation: Secondary | ICD-10-CM | POA: Diagnosis not present

## 2018-05-08 DIAGNOSIS — R109 Unspecified abdominal pain: Secondary | ICD-10-CM | POA: Diagnosis not present

## 2018-05-22 ENCOUNTER — Encounter: Payer: Self-pay | Admitting: Family Medicine

## 2018-05-22 DIAGNOSIS — K317 Polyp of stomach and duodenum: Secondary | ICD-10-CM | POA: Diagnosis not present

## 2018-05-22 DIAGNOSIS — K259 Gastric ulcer, unspecified as acute or chronic, without hemorrhage or perforation: Secondary | ICD-10-CM | POA: Diagnosis not present

## 2018-05-22 DIAGNOSIS — R140 Abdominal distension (gaseous): Secondary | ICD-10-CM | POA: Diagnosis not present

## 2018-05-22 DIAGNOSIS — R142 Eructation: Secondary | ICD-10-CM | POA: Diagnosis not present

## 2018-05-22 DIAGNOSIS — R1084 Generalized abdominal pain: Secondary | ICD-10-CM | POA: Diagnosis not present

## 2018-05-22 DIAGNOSIS — K2930 Chronic superficial gastritis without bleeding: Secondary | ICD-10-CM | POA: Diagnosis not present

## 2018-05-28 DIAGNOSIS — K2930 Chronic superficial gastritis without bleeding: Secondary | ICD-10-CM | POA: Diagnosis not present

## 2018-05-28 DIAGNOSIS — K317 Polyp of stomach and duodenum: Secondary | ICD-10-CM | POA: Diagnosis not present

## 2018-08-24 DIAGNOSIS — R140 Abdominal distension (gaseous): Secondary | ICD-10-CM | POA: Diagnosis not present

## 2018-08-24 DIAGNOSIS — K259 Gastric ulcer, unspecified as acute or chronic, without hemorrhage or perforation: Secondary | ICD-10-CM | POA: Diagnosis not present

## 2018-11-19 ENCOUNTER — Other Ambulatory Visit: Payer: Self-pay | Admitting: Gynecology

## 2018-11-19 ENCOUNTER — Other Ambulatory Visit: Payer: Self-pay

## 2018-11-19 MED ORDER — NONFORMULARY OR COMPOUNDED ITEM: 0 refills | Status: DC

## 2018-11-19 NOTE — Telephone Encounter (Signed)
Annual exam scheduled on 02/13/19, Rx called in.

## 2019-01-16 ENCOUNTER — Other Ambulatory Visit: Payer: Self-pay

## 2019-01-17 ENCOUNTER — Ambulatory Visit (INDEPENDENT_AMBULATORY_CARE_PROVIDER_SITE_OTHER): Payer: Medicare Other

## 2019-01-17 ENCOUNTER — Other Ambulatory Visit: Payer: Self-pay

## 2019-01-17 DIAGNOSIS — Z23 Encounter for immunization: Secondary | ICD-10-CM | POA: Diagnosis not present

## 2019-01-24 ENCOUNTER — Encounter: Payer: Self-pay | Admitting: Gynecology

## 2019-02-13 ENCOUNTER — Encounter: Payer: Medicare Other | Admitting: Gynecology

## 2019-02-28 ENCOUNTER — Other Ambulatory Visit: Payer: Self-pay | Admitting: Gynecology

## 2019-04-30 DIAGNOSIS — Z23 Encounter for immunization: Secondary | ICD-10-CM | POA: Diagnosis not present

## 2019-06-03 ENCOUNTER — Other Ambulatory Visit: Payer: Self-pay | Admitting: Obstetrics and Gynecology

## 2019-06-03 DIAGNOSIS — Z1231 Encounter for screening mammogram for malignant neoplasm of breast: Secondary | ICD-10-CM

## 2019-06-13 DIAGNOSIS — H43813 Vitreous degeneration, bilateral: Secondary | ICD-10-CM | POA: Diagnosis not present

## 2019-06-13 DIAGNOSIS — H52201 Unspecified astigmatism, right eye: Secondary | ICD-10-CM | POA: Diagnosis not present

## 2019-06-13 DIAGNOSIS — H04123 Dry eye syndrome of bilateral lacrimal glands: Secondary | ICD-10-CM | POA: Diagnosis not present

## 2019-06-20 DIAGNOSIS — R140 Abdominal distension (gaseous): Secondary | ICD-10-CM | POA: Diagnosis not present

## 2019-06-20 DIAGNOSIS — Z1211 Encounter for screening for malignant neoplasm of colon: Secondary | ICD-10-CM | POA: Diagnosis not present

## 2019-06-20 DIAGNOSIS — K259 Gastric ulcer, unspecified as acute or chronic, without hemorrhage or perforation: Secondary | ICD-10-CM | POA: Diagnosis not present

## 2019-07-05 DIAGNOSIS — Z1159 Encounter for screening for other viral diseases: Secondary | ICD-10-CM | POA: Diagnosis not present

## 2019-07-10 DIAGNOSIS — R140 Abdominal distension (gaseous): Secondary | ICD-10-CM | POA: Diagnosis not present

## 2019-07-10 DIAGNOSIS — K219 Gastro-esophageal reflux disease without esophagitis: Secondary | ICD-10-CM | POA: Diagnosis not present

## 2019-07-10 DIAGNOSIS — K253 Acute gastric ulcer without hemorrhage or perforation: Secondary | ICD-10-CM | POA: Diagnosis not present

## 2019-07-10 DIAGNOSIS — K2970 Gastritis, unspecified, without bleeding: Secondary | ICD-10-CM | POA: Diagnosis not present

## 2019-07-11 ENCOUNTER — Other Ambulatory Visit: Payer: Self-pay

## 2019-07-11 ENCOUNTER — Ambulatory Visit
Admission: RE | Admit: 2019-07-11 | Discharge: 2019-07-11 | Disposition: A | Discharge status: Home / Self Care | Payer: Medicare Other | Source: Ambulatory Visit | Attending: Obstetrics and Gynecology | Admitting: Obstetrics and Gynecology

## 2019-07-11 DIAGNOSIS — Z1231 Encounter for screening mammogram for malignant neoplasm of breast: Secondary | ICD-10-CM

## 2019-07-12 DIAGNOSIS — K2970 Gastritis, unspecified, without bleeding: Secondary | ICD-10-CM | POA: Diagnosis not present

## 2019-07-12 DIAGNOSIS — K219 Gastro-esophageal reflux disease without esophagitis: Secondary | ICD-10-CM | POA: Diagnosis not present

## 2019-07-12 DIAGNOSIS — K253 Acute gastric ulcer without hemorrhage or perforation: Secondary | ICD-10-CM | POA: Diagnosis not present

## 2019-07-12 DIAGNOSIS — R140 Abdominal distension (gaseous): Secondary | ICD-10-CM | POA: Diagnosis not present

## 2019-07-18 ENCOUNTER — Other Ambulatory Visit: Payer: Self-pay

## 2019-07-22 ENCOUNTER — Encounter: Payer: Self-pay | Admitting: Obstetrics and Gynecology

## 2019-07-22 ENCOUNTER — Ambulatory Visit (INDEPENDENT_AMBULATORY_CARE_PROVIDER_SITE_OTHER): Payer: Medicare Other | Admitting: Obstetrics and Gynecology

## 2019-07-22 ENCOUNTER — Other Ambulatory Visit: Payer: Self-pay

## 2019-07-22 VITALS — BP 122/76 | Ht 64.0 in | Wt 152.0 lb

## 2019-07-22 DIAGNOSIS — M8580 Other specified disorders of bone density and structure, unspecified site: Secondary | ICD-10-CM

## 2019-07-22 DIAGNOSIS — M8588 Other specified disorders of bone density and structure, other site: Secondary | ICD-10-CM | POA: Diagnosis not present

## 2019-07-22 DIAGNOSIS — Z01419 Encounter for gynecological examination (general) (routine) without abnormal findings: Secondary | ICD-10-CM | POA: Diagnosis not present

## 2019-07-22 DIAGNOSIS — Z79890 Hormone replacement therapy (postmenopausal): Secondary | ICD-10-CM | POA: Diagnosis not present

## 2019-07-22 MED ORDER — NONFORMULARY OR COMPOUNDED ITEM: 0 refills | Status: DC

## 2019-07-22 NOTE — Progress Notes (Signed)
Natalie Browning 1945/11/17 [id number]  SUBJECTIVE:  74 y.o. G0P0 female for annual routine gynecologic exam. She has no gynecologic concerns.  She has been dealing with gastritis.  Current Outpatient Medications  Medication Sig Dispense Refill  . Calcium Carbonate-Vitamin D (CALCIUM + D PO) Take by mouth. Reported on 07/15/2015    . NONFORMULARY OR COMPOUNDED ITEM compound bio-identical progesterone 200 mg 1 po daily day 1-12 monthly 36 each 0  . NONFORMULARY OR COMPOUNDED ITEM Triest 1.25 1 ml daily (formulated estrogen cream) 90 each 0  . Progesterone Micronized (PROGESTERONE, BULK,) POWD TAKE 1 CAPSULE EVERY DAY ON DAYS 1-12. 36 g 0  . RESTASIS 0.05 % ophthalmic emulsion      No current facility-administered medications for this visit.   Allergies: Ciprofloxacin, Codeine, Macrobid [nitrofurantoin], and Simethicone  No LMP recorded. Patient is postmenopausal.  Past medical history,surgical history, problem list, medications, allergies, family history and social history were all reviewed and documented as reviewed in the EPIC chart.  ROS:  Feeling well. No dyspnea or chest pain on exertion.  No abdominal pain, change in bowel habits, black or bloody stools.  No urinary tract symptoms. GYN ROS: no abnormal bleeding, pelvic pain or discharge, no breast pain or new or enlarging lumps on self exam. No neurological complaints.   OBJECTIVE:  BP 122/76   Ht 5\' 4"  (1.626 m)   Wt 152 lb (68.9 kg)   BMI 26.09 kg/m  The patient appears well, alert, oriented x 3, in no distress. ENT normal.  Neck supple. No cervical or supraclavicular adenopathy or thyromegaly.  Lungs are clear, good air entry, no wheezes, rhonchi or rales. S1 and S2 normal, no murmurs, regular rate and rhythm.  Abdomen soft without tenderness, guarding, mass or organomegaly.  Neurological is normal, no focal findings.  BREAST EXAM: breasts appear normal, no suspicious masses, no skin or nipple changes or axillary  nodes  PELVIC EXAM: VULVA: normal appearing vulva with no masses, tenderness or lesions, atrophic changes, VAGINA: normal appearing vagina with normal color and discharge, no lesions, CERVIX: normal appearing cervix without discharge or lesions, UTERUS: uterus is normal size, shape, consistency and nontender, ADNEXA: normal adnexa in size, nontender and no masses, RECTAL: normal rectal, no masses  Chaperone: [name] present during the examination  ASSESSMENT:  74 y.o. G0P0 here for annual gynecologic exam  PLAN:   1.  Postmenopausal/HRT.  She continues on Triest estrogen cream and Prometrium 200 mg nightly.  She is using a minimum amount 2 pumps daily instead of 4 of the estrogen cream to achieve benefit and avoidance of vasomotor symptoms.  She does request a refill.  We reviewed the risks of HRT to include heart attack, stroke, DVT, PE, breast cancer, endometrial cancer.  Refill x1 year is provided. 2. Pap smear 2019.  History of cryosurgery a number of years ago with normal Pap smears since.  Can further discuss option to stop screening at her next 3-year interval next year. 3. Mammogram 06/2019.  Normal breast exam today.  She is reminded to schedule an annual mammogram this year when due. 4. Colonoscopy 2012.  Recommended that she follow up at the recommended interval.   5. DEXA 07/2017.  T score -2.0, FRAX 21% / 11%.  Treatment was recommended at that time, but she did not elect to start treatment.  She understands that she is increased risk for a debilitating fracture.  She is currently having a gastritis episode and this would not be the time  to start oral bisphosphonates, but there are other treatment options available.  At the very least I do recommend repeating the DEXA scan now.  She indicates that she is not interested in scheduling it at this time because she has had too many doctor appointments lately. 6. Health maintenance.  No labs today as she normally has these completed with her  primary care provider.   Return annually or sooner, prn.  Joseph Pierini MD, FACOG  07/22/19

## 2019-07-23 ENCOUNTER — Telehealth: Payer: Self-pay | Admitting: *Deleted

## 2019-07-23 ENCOUNTER — Other Ambulatory Visit: Payer: Self-pay | Admitting: Obstetrics and Gynecology

## 2019-07-23 NOTE — Telephone Encounter (Signed)
Patient called stating Rx from [facility] on 07/22/19 for compound Triest estrogen cream and Prometrium 200 mg nightly. Was never called in. Rx called in today.

## 2019-11-18 ENCOUNTER — Other Ambulatory Visit: Payer: Self-pay | Admitting: Obstetrics and Gynecology

## 2019-11-18 NOTE — Telephone Encounter (Signed)
Rx called in 

## 2020-01-22 ENCOUNTER — Ambulatory Visit (INDEPENDENT_AMBULATORY_CARE_PROVIDER_SITE_OTHER): Payer: Medicare Other

## 2020-01-22 ENCOUNTER — Other Ambulatory Visit: Payer: Self-pay

## 2020-01-22 DIAGNOSIS — Z23 Encounter for immunization: Secondary | ICD-10-CM | POA: Diagnosis not present

## 2020-02-20 DIAGNOSIS — Z23 Encounter for immunization: Secondary | ICD-10-CM | POA: Diagnosis not present

## 2020-02-25 ENCOUNTER — Other Ambulatory Visit: Payer: Self-pay

## 2020-02-25 MED ORDER — NONFORMULARY OR COMPOUNDED ITEM: 1 refills | Status: DC

## 2020-03-16 ENCOUNTER — Other Ambulatory Visit: Payer: Self-pay

## 2020-03-16 ENCOUNTER — Other Ambulatory Visit: Payer: Self-pay | Admitting: Family Medicine

## 2020-03-16 ENCOUNTER — Ambulatory Visit (INDEPENDENT_AMBULATORY_CARE_PROVIDER_SITE_OTHER): Payer: Medicare Other | Admitting: Family Medicine

## 2020-03-16 VITALS — BP 130/80 | HR 78 | Temp 98.1°F | Ht 64.0 in | Wt 154.0 lb

## 2020-03-16 DIAGNOSIS — G44209 Tension-type headache, unspecified, not intractable: Secondary | ICD-10-CM

## 2020-03-16 MED ORDER — CELECOXIB 200 MG PO CAPS: 200.0000 mg | ORAL_CAPSULE | Freq: Two times a day (BID) | ORAL | 1 refills | Status: DC

## 2020-03-16 NOTE — Progress Notes (Signed)
Subjective:    Patient ID: Natalie Browning, female    DOB: [birth year], 74 y.o.   MRN: [id number]  HPI I saw the patient 2 years ago for a headache in her occiput.  She states that the headache went away after she took the diclofenac and Zanaflex although the Zanaflex made her extremely sleepy and groggy.  She states that approximately 2 months ago she started getting the same headache again.  Over the last 2 weeks has become a daily headache.  Is a sharp pain occasionally in her occiput just at the right of center near the insertion of the trapezius muscle.  There is no tenderness to palpation in that area.  There is no palpable deformity in that area.  She denies any blurry vision or dizziness or ataxia.  The headache is nonpulsatile.  It is dull most times occasionally it can be sharp.  Sometimes she will wake up from sleep with a headache.  She has been checking her blood pressure and has been averaging between 130 and 135/70-82.  She denies any nausea or vomiting or neurologic deficits.  Past Medical History:  Diagnosis Date  . Acid reflux   . Allergy   . Anemia   . Cataract   . Cervical dysplasia 1970's  . Gastritis   . Osteopenia 09/2013   T score -2.0 FRAX 15%/2.8% overall stable from prior DEXA 2012   Past Surgical History:  Procedure Laterality Date  . cataract surg    . COLPOSCOPY    . DILATION AND CURETTAGE OF UTERUS  03/18/2010   Endometrial polyp  . EYE SURGERY     Laser  . GYNECOLOGIC CRYOSURGERY    . NASAL SINUS SURGERY    . TONSILLECTOMY    . UPPER GASTROINTESTINAL ENDOSCOPY  2006   Current Outpatient Medications on File Prior to Visit  Medication Sig Dispense Refill  . NONFORMULARY OR COMPOUNDED ITEM Triest 1.25 1 ml daily (formulated estrogen cream) 90 each 1  . Progesterone Micronized (PROGESTERONE, BULK,) POWD TAKE 1 CAPSULE EVERY DAY ON DAYS 1-12. 36 g 3  . RESTASIS 0.05 % ophthalmic emulsion     . Calcium Carbonate-Vitamin D (CALCIUM + D PO) Take by mouth.  Reported on 07/15/2015 (Patient not taking: Reported on 03/16/2020)    . NONFORMULARY OR COMPOUNDED ITEM compound bio-identical progesterone 200 mg 1 po daily day 1-12 monthly (Patient not taking: Reported on 03/16/2020) 36 each 0   No current facility-administered medications on file prior to visit.   Allergies  Allergen Reactions  . Ciprofloxacin   . Codeine Itching and Nausea Only  . Macrobid [Nitrofurantoin] Nausea And Vomiting  . Simethicone Nausea Only   Social History   Socioeconomic History  . Marital status: Married    Spouse name: Not on file  . Number of children: Not on file  . Years of education: Not on file  . Highest education level: Not on file  Occupational History  . Not on file  Tobacco Use  . Smoking status: Never Smoker  . Smokeless tobacco: Never Used  Vaping Use  . Vaping Use: Never used  Substance and Sexual Activity  . Alcohol use: No    Alcohol/week: 0.0 standard drinks  . Drug use: No  . Sexual activity: Yes    Birth control/protection: Post-menopausal    Comment: 1st intercourse 58 yo-1 partner  Other Topics Concern  . Not on file  Social History Narrative  . Not on file   Social Determinants of  Health   Financial Resource Strain:   . Difficulty of Paying Living Expenses: Not on file  Food Insecurity:   . Worried About Charity fundraiser in the Last Year: Not on file  . Ran Out of Food in the Last Year: Not on file  Transportation Needs:   . Lack of Transportation (Medical): Not on file  . Lack of Transportation (Non-Medical): Not on file  Physical Activity:   . Days of Exercise per Week: Not on file  . Minutes of Exercise per Session: Not on file  Stress:   . Feeling of Stress : Not on file  Social Connections:   . Frequency of Communication with Friends and Family: Not on file  . Frequency of Social Gatherings with Friends and Family: Not on file  . Attends Religious Services: Not on file  . Active Member of Clubs or  Organizations: Not on file  . Attends Archivist Meetings: Not on file  . Marital Status: Not on file  Intimate Partner Violence:   . Fear of Current or Ex-Partner: Not on file  . Emotionally Abused: Not on file  . Physically Abused: Not on file  . Sexually Abused: Not on file      Review of Systems  Neurological: Positive for headaches.  All other systems reviewed and are negative.      Objective:   Physical Exam Vitals reviewed.  Constitutional:      General: She is not in acute distress.    Appearance: She is well-developed. She is not diaphoretic.  HENT:     Right Ear: External ear normal.     Left Ear: External ear normal.     Nose: Nose normal.     Mouth/Throat:     Pharynx: No oropharyngeal exudate.  Eyes:     General:        Right eye: No discharge.        Left eye: No discharge.     Conjunctiva/sclera: Conjunctivae normal.     Pupils: Pupils are equal, round, and reactive to light.  Neck:   Cardiovascular:     Rate and Rhythm: Normal rate and regular rhythm.     Heart sounds: Normal heart sounds.  Pulmonary:     Effort: Pulmonary effort is normal.     Breath sounds: Normal breath sounds.  Musculoskeletal:     Cervical back: Normal range of motion and neck supple. No erythema, rigidity or crepitus. Muscular tenderness present. No pain with movement or spinous process tenderness. Normal range of motion.  Lymphadenopathy:     Cervical: No cervical adenopathy.  Neurological:     Mental Status: She is alert and oriented to person, place, and time.     Cranial Nerves: No cranial nerve deficit.     Motor: No abnormal muscle tone.     Coordination: Coordination normal.     Deep Tendon Reflexes: Reflexes are normal and symmetric.           Assessment & Plan:  Tension headache  I suspect that this is a muscular tension headache or musculoskeletal headache.  It seems very superficial.  We will treated with Celebrex 200 mg p.o. twice daily for 1  to 2 weeks and see if the headache improves.  Her neurologic exam is completely normal today and she denies any concerning symptoms.  If headache worsens, I would recommend neuroimaging.

## 2020-06-12 DIAGNOSIS — H52203 Unspecified astigmatism, bilateral: Secondary | ICD-10-CM | POA: Diagnosis not present

## 2020-06-12 DIAGNOSIS — H02831 Dermatochalasis of right upper eyelid: Secondary | ICD-10-CM | POA: Diagnosis not present

## 2020-06-12 DIAGNOSIS — H04123 Dry eye syndrome of bilateral lacrimal glands: Secondary | ICD-10-CM | POA: Diagnosis not present

## 2020-06-12 DIAGNOSIS — H43813 Vitreous degeneration, bilateral: Secondary | ICD-10-CM | POA: Diagnosis not present

## 2020-09-04 ENCOUNTER — Other Ambulatory Visit: Payer: Self-pay | Admitting: Family Medicine

## 2020-09-04 DIAGNOSIS — Z1231 Encounter for screening mammogram for malignant neoplasm of breast: Secondary | ICD-10-CM

## 2020-09-05 ENCOUNTER — Other Ambulatory Visit: Payer: Self-pay

## 2020-09-05 ENCOUNTER — Ambulatory Visit
Admission: RE | Admit: 2020-09-05 | Discharge: 2020-09-05 | Disposition: A | Discharge status: Home / Self Care | Payer: Medicare Other | Source: Ambulatory Visit | Attending: Family Medicine | Admitting: Family Medicine

## 2020-09-05 DIAGNOSIS — Z1231 Encounter for screening mammogram for malignant neoplasm of breast: Secondary | ICD-10-CM | POA: Diagnosis not present

## 2020-10-01 ENCOUNTER — Other Ambulatory Visit: Payer: Self-pay

## 2020-10-02 ENCOUNTER — Telehealth: Payer: Self-pay | Admitting: *Deleted

## 2020-10-02 NOTE — Telephone Encounter (Signed)
Patient called requesting refill on Triest 1.25 (formulated estrogen cream) from [facility]. Message sent to Abigail Butts to schedule annual exam before routing to provider for approval.

## 2020-10-05 ENCOUNTER — Other Ambulatory Visit: Payer: Self-pay

## 2020-10-05 MED ORDER — NONFORMULARY OR COMPOUNDED ITEM
0 refills | Status: DC
Start: 2020-10-05 — End: 2020-11-24

## 2020-10-05 NOTE — Telephone Encounter (Signed)
AEX scheduled 11/24/20 with TW. Last AEX 07/22/19 with Dr. Delilah Shan.   Requesting refill Triest.

## 2020-10-05 NOTE — Telephone Encounter (Signed)
Patient annual exam is scheduled on 11/24/20. Natalie Browning routed refill request for compound Triest 1.25

## 2020-10-05 NOTE — Telephone Encounter (Signed)
Rx called in 

## 2020-10-30 ENCOUNTER — Other Ambulatory Visit: Payer: Self-pay

## 2020-10-30 ENCOUNTER — Encounter: Payer: Self-pay | Admitting: Family Medicine

## 2020-10-30 ENCOUNTER — Ambulatory Visit (INDEPENDENT_AMBULATORY_CARE_PROVIDER_SITE_OTHER): Payer: Medicare Other | Admitting: Family Medicine

## 2020-10-30 VITALS — BP 118/62 | HR 76 | Temp 97.9°F | Resp 14 | Ht 64.0 in | Wt 152.0 lb

## 2020-10-30 DIAGNOSIS — R739 Hyperglycemia, unspecified: Secondary | ICD-10-CM | POA: Diagnosis not present

## 2020-10-30 DIAGNOSIS — H538 Other visual disturbances: Secondary | ICD-10-CM

## 2020-10-30 DIAGNOSIS — G609 Hereditary and idiopathic neuropathy, unspecified: Secondary | ICD-10-CM | POA: Diagnosis not present

## 2020-10-30 NOTE — Progress Notes (Signed)
Subjective:    Patient ID: Natalie Browning, female    DOB: 03/15/1946, 75 y.o.   MRN: [id number]  HPI Patient states that she has developed some numbness and tingling on the plantar aspects of both feet which she notices primarily at night when she lays down to go to sleep.  She denies any low back pain.  She denies any weakness in her legs.  She denies any numbness or tingling proximal to the ankle.  She has normal 2/4 reflexes at the Achilles as well as at the patella.  She has normal sensation to 10 g monofilament and vibration in both feet.  There is no visible deformities in the feet.  She denies any burning or stinging pain.  It is more of a paresthesias that she is reporting at night.  She is concerned that this could be diabetes.  She states that it times, she has been told that her sugar is high although there is no record of this that I can find in her chart. Past Medical History:  Diagnosis Date   Acid reflux    Allergy    Anemia    Cataract    Cervical dysplasia 1970's   Gastritis    Osteopenia 09/2013   T score -2.0 FRAX 15%/2.8% overall stable from prior DEXA 2012   Past Surgical History:  Procedure Laterality Date   cataract surg     COLPOSCOPY     DILATION AND CURETTAGE OF UTERUS  03/18/2010   Endometrial polyp   EYE SURGERY     Laser   GYNECOLOGIC CRYOSURGERY     NASAL SINUS SURGERY     TONSILLECTOMY     UPPER GASTROINTESTINAL ENDOSCOPY  2006   Current Outpatient Medications on File Prior to Visit  Medication Sig Dispense Refill   Calcium Carbonate-Vitamin D (CALCIUM + D PO) Take by mouth. Reported on 07/15/2015     NONFORMULARY OR COMPOUNDED ITEM Triest 1.25 1 ml daily (formulated estrogen cream) 90 each 0   Progesterone Micronized (PROGESTERONE, BULK,) POWD TAKE 1 CAPSULE EVERY DAY ON DAYS 1-12. 36 g 3   RESTASIS 0.05 % ophthalmic emulsion      No current facility-administered medications on file prior to visit.   Marland Kitchenall Social History   Socioeconomic History    Marital status: Married    Spouse name: Not on file   Number of children: Not on file   Years of education: Not on file   Highest education level: Not on file  Occupational History   Not on file  Tobacco Use   Smoking status: Never   Smokeless tobacco: Never  Vaping Use   Vaping Use: Never used  Substance and Sexual Activity   Alcohol use: No    Alcohol/week: 0.0 standard drinks   Drug use: No   Sexual activity: Yes    Birth control/protection: Post-menopausal    Comment: 1st intercourse 21 yo-1 partner  Other Topics Concern   Not on file  Social History Narrative   Not on file   Social Determinants of Health   Financial Resource Strain: Not on file  Food Insecurity: Not on file  Transportation Needs: Not on file  Physical Activity: Not on file  Stress: Not on file  Social Connections: Not on file  Intimate Partner Violence: Not on file      Review of Systems  All other systems reviewed and are negative.     Objective:   Physical Exam Vitals reviewed.  Constitutional:  Appearance: Normal appearance. She is normal weight.  Cardiovascular:     Rate and Rhythm: Normal rate and regular rhythm.     Pulses: Normal pulses.     Heart sounds: Normal heart sounds.  Neurological:     General: No focal deficit present.     Mental Status: She is alert and oriented to person, place, and time. Mental status is at baseline.     Cranial Nerves: No cranial nerve deficit.     Sensory: No sensory deficit.     Motor: No weakness.     Coordination: Coordination normal.     Gait: Gait normal.     Deep Tendon Reflexes: Reflexes normal.          Assessment & Plan:  Idiopathic peripheral neuropathy - Plan: Vitamin B12, TSH, Hemoglobin A1c, COMPLETE METABOLIC PANEL WITH GFR, CBC with Differential/Platelet  Blurry vision - Plan: Hemoglobin A1c  Hyperglycemia - Plan: Vitamin B12, TSH, Hemoglobin A1c, COMPLETE METABOLIC PANEL WITH GFR, CBC with Differential/Platelet I  believe that this is most likely peripheral neuropathy.  Patient states that she does at times have blurry vision but she attributes this to cataracts.  She also reports vague instances of hyperglycemia.  For that reason I will check an A1c to screen for diabetes.  I will also check a vitamin B12 to screen for vitamin B12 deficiency as well as a TSH.  I will also obtain a CBC and a CMP to evaluate for any electrolyte disturbances.

## 2020-10-31 LAB — COMPLETE METABOLIC PANEL WITH GFR
AG Ratio: 1.8 (calc) (ref 1.0–2.5)
ALT: 11 U/L (ref 6–29)
AST: 19 U/L (ref 10–35)
Albumin: 4.2 g/dL (ref 3.6–5.1)
Alkaline phosphatase (APISO): 84 U/L (ref 37–153)
BUN: 15 mg/dL (ref 7–25)
CO2: 25 mmol/L (ref 20–32)
Calcium: 9.5 mg/dL (ref 8.6–10.4)
Chloride: 104 mmol/L (ref 98–110)
Creat: 0.89 mg/dL (ref 0.60–1.00)
Globulin: 2.4 g/dL (calc) (ref 1.9–3.7)
Glucose, Bld: 103 mg/dL — ABNORMAL HIGH (ref 65–99)
Potassium: 4.5 mmol/L (ref 3.5–5.3)
Sodium: 139 mmol/L (ref 135–146)
Total Bilirubin: 0.8 mg/dL (ref 0.2–1.2)
Total Protein: 6.6 g/dL (ref 6.1–8.1)
eGFR: 67 mL/min/{1.73_m2} (ref >=60)

## 2020-10-31 LAB — CBC WITH DIFFERENTIAL/PLATELET
Absolute Monocytes: 322 cells/uL (ref 200–950)
Basophils Absolute: 19 cells/uL (ref 0–200)
Basophils Relative: 0.4 %
Eosinophils Absolute: 149 cells/uL (ref 15–500)
Eosinophils Relative: 3.1 %
HCT: 41.6 % (ref 35.0–45.0)
Hemoglobin: 13.8 g/dL (ref 11.7–15.5)
Lymphs Abs: 1589 cells/uL (ref 850–3900)
MCH: 30.2 pg (ref 27.0–33.0)
MCHC: 33.2 g/dL (ref 32.0–36.0)
MCV: 91 fL (ref 80.0–100.0)
MPV: 10.9 fL (ref 7.5–12.5)
Monocytes Relative: 6.7 %
Neutro Abs: 2722 cells/uL (ref 1500–7800)
Neutrophils Relative %: 56.7 %
Platelets: 258 10*3/uL (ref 140–400)
RBC: 4.57 10*6/uL (ref 3.80–5.10)
RDW: 12.2 % (ref 11.0–15.0)
Total Lymphocyte: 33.1 %
WBC: 4.8 10*3/uL (ref 3.8–10.8)

## 2020-10-31 LAB — HEMOGLOBIN A1C
Hgb A1c MFr Bld: 6.4 % of total Hgb — ABNORMAL HIGH (ref <5.7)
Mean Plasma Glucose: 137 mg/dL
eAG (mmol/L): 7.6 mmol/L

## 2020-10-31 LAB — TSH: TSH: 2.77 mIU/L (ref 0.40–4.50)

## 2020-10-31 LAB — VITAMIN B12: Vitamin B-12: 275 pg/mL (ref 200–1100)

## 2020-11-03 ENCOUNTER — Other Ambulatory Visit: Payer: Self-pay | Admitting: *Deleted

## 2020-11-03 MED ORDER — VITAMIN B-12 1000 MCG PO TABS: 1000.0000 ug | ORAL_TABLET | Freq: Every day | ORAL | Status: AC

## 2020-11-10 ENCOUNTER — Telehealth: Payer: Self-pay | Admitting: *Deleted

## 2020-11-10 MED ORDER — NIRMATRELVIR/RITONAVIR (PAXLOVID)TABLET: 3.0000 | ORAL_TABLET | Freq: Two times a day (BID) | ORAL | 0 refills | Status: AC

## 2020-11-10 NOTE — Telephone Encounter (Signed)
Patient tested positive for COVID on 11/10/2020 with home test.   Sx include nasal congestion, nasal drainage, cough, malaise, low grade fever/chills.  Advised to continue symptom management with OTC medications: Tylenol/ Ibuprofen for fever/ body aches, Mucinex/ Delsym for cough/ chest congestion, Afrin/Sudafed/nasal saline for sinus pressure/ nasal congestion.  GFR 67. Order for Paxlovid sent to pharmacy. Advised possible SE include nausea, diarrhea, loss of taste and hypertension.  If chest pain, shortness of breath, fever >104 that is unresponsive to antipyretics noted, or if patient is unable to tolerate fluids, advised to go to ER for evaluation.   Advised that the CDC recommends the following criteria prior to ending isolation in vaccinated persons at least 5 days since symptoms onset, the additional 5 days of masking AND 3 days fever free without antipyretics (Tylenol or Ibuprofen) AND improvement in respiratory symptoms.

## 2020-11-24 ENCOUNTER — Encounter: Payer: Self-pay | Admitting: Nurse Practitioner

## 2020-11-24 ENCOUNTER — Other Ambulatory Visit: Payer: Self-pay

## 2020-11-24 ENCOUNTER — Ambulatory Visit (INDEPENDENT_AMBULATORY_CARE_PROVIDER_SITE_OTHER): Payer: Medicare Other | Admitting: Nurse Practitioner

## 2020-11-24 VITALS — BP 118/80 | Ht 64.0 in | Wt 152.0 lb

## 2020-11-24 DIAGNOSIS — Z01419 Encounter for gynecological examination (general) (routine) without abnormal findings: Secondary | ICD-10-CM | POA: Diagnosis not present

## 2020-11-24 DIAGNOSIS — M8589 Other specified disorders of bone density and structure, multiple sites: Secondary | ICD-10-CM

## 2020-11-24 DIAGNOSIS — Z780 Asymptomatic menopausal state: Secondary | ICD-10-CM

## 2020-11-24 DIAGNOSIS — Z79890 Hormone replacement therapy (postmenopausal): Secondary | ICD-10-CM

## 2020-11-24 MED ORDER — NONFORMULARY OR COMPOUNDED ITEM
2 refills | Status: DC
Start: 2020-11-24 — End: 2021-06-11

## 2020-11-24 MED ORDER — PROGESTERONE MICRONIZED POWD: 3 refills | Status: DC

## 2020-11-24 NOTE — Progress Notes (Signed)
Natalie Browning [birth year] [id number]   History:  75 y.o. G0 presents for breast and pelvic exam. No GYN complaints. Postmenopausal - on HRT. She uses Triest estrogen cream (2 pumps daily instead of 4). She has tried to stop in the past and cannot tolerate. Cryosurgery years ago, subsequent paps normal. Osteopenia with elevated FRAX. She was on Fosamax years ago for a few months but did not tolerate. She declines treatment at this time.   Gynecologic History No LMP recorded. Patient is postmenopausal.   Contraception: post menopausal status  Health Maintenance Last Pap: No longer screening per guidelines Last mammogram: 09/05/2020. Results were: Normal Last colonoscopy: 2012. Results were: Normal, 10-year recall Last Dexa: 08/15/2017. Results were: T-score -1.9, FRAX 21% / 11%  Past medical history, past surgical history, family history and social history were all reviewed and documented in the EPIC chart. Married.   ROS:  A ROS was performed and pertinent positives and negatives are included.  Exam:  Vitals:   11/24/20 1356  BP: 118/80  Weight: 152 lb (68.9 kg)  Height: '5\' 4"'$  (1.626 m)   Body mass index is 26.09 kg/m.  General appearance:  Normal Thyroid:  Symmetrical, normal in size, without palpable masses or nodularity. Respiratory  Auscultation:  Clear without wheezing or rhonchi Cardiovascular  Auscultation:  Regular rate, without rubs, murmurs or gallops  Edema/varicosities:  Not grossly evident Abdominal  Soft,nontender, without masses, guarding or rebound.  Liver/spleen:  No organomegaly noted  Hernia:  None appreciated  Skin  Inspection:  Grossly normal Breasts: Examined lying and sitting.   Right: Without masses, retractions, nipple discharge or axillary adenopathy.   Left: Without masses, retractions, nipple discharge or axillary adenopathy. Genitourinary   Inguinal/mons:  Normal without inguinal adenopathy  External genitalia:  Normal appearing vulva with  no masses, tenderness, or lesions  BUS/Urethra/Skene's glands:  Normal  Vagina:  Normal appearing with normal color and discharge, no lesions. Atrophic changes  Cervix:  Normal appearing without discharge or lesions. Atrophic changes  Uterus:  Normal in size, shape and contour.  Midline and mobile, nontender  Adnexa/parametria:     Rt: Normal in size, without masses or tenderness.   Lt: Normal in size, without masses or tenderness.  Anus and perineum: Normal  Digital rectal exam: Normal sphincter tone without palpated masses or tenderness  Patient informed chaperone available to be present for breast and pelvic exam. Patient has requested no chaperone to be present. Patient has been advised what will be completed during breast and pelvic exam.   Assessment/Plan:  75 y.o. G0 for breast and pelvic exam.   Well female exam with routine gynecological exam - Education provided on SBEs, importance of preventative screenings, current guidelines, high calcium diet, regular exercise, and multivitamin daily. Labs with PCP.   Postmenopausal - on ERT, no bleeding.   Osteopenia of multiple sites - 2019 Dexa T-score-1.9 with elevated FRAX. She was on Fosamax years ago for a few months but did not tolerate. She declines treatment at this time. Recommend repeating Dexa and she plans to discuss with PCP. Continue Vitamin D/calcium supplement.   Hormone replacement therapy (HRT) - Plan: NONFORMULARY OR COMPOUNDED ITEM, Progesterone Micronized (PROGESTERONE, BULK,) POWD. She is using 2 pumps of Triest estrogen cream daily instead of 4. She has tried to wean but does not tolerate night sweats. We discussed the risks with continued use and she would like to continue. Refill provided.   Screening for cervical cancer - Cryo procedure many years ago,  subsequent paps normal. No longer screening per guidelines.   Screening for breast cancer - Normal mammogram history.  Continue annual screenings.  Normal breast exam  today.  Screening for colon cancer - 2012 colonoscopy. Due this November and plans to get this scheduled.   Return in 1 year for breast and pelvic exam.   Tamela Gammon DNP, 2:35 PM 11/24/2020

## 2021-01-12 DIAGNOSIS — Z23 Encounter for immunization: Secondary | ICD-10-CM | POA: Diagnosis not present

## 2021-01-22 ENCOUNTER — Ambulatory Visit (INDEPENDENT_AMBULATORY_CARE_PROVIDER_SITE_OTHER): Payer: Medicare Other | Admitting: *Deleted

## 2021-01-22 ENCOUNTER — Other Ambulatory Visit: Payer: Self-pay

## 2021-01-22 DIAGNOSIS — Z23 Encounter for immunization: Secondary | ICD-10-CM

## 2021-02-18 DIAGNOSIS — K219 Gastro-esophageal reflux disease without esophagitis: Secondary | ICD-10-CM | POA: Insufficient documentation

## 2021-02-18 DIAGNOSIS — R142 Eructation: Secondary | ICD-10-CM | POA: Insufficient documentation

## 2021-02-18 DIAGNOSIS — Z1211 Encounter for screening for malignant neoplasm of colon: Secondary | ICD-10-CM | POA: Insufficient documentation

## 2021-02-18 DIAGNOSIS — G4700 Insomnia, unspecified: Secondary | ICD-10-CM | POA: Insufficient documentation

## 2021-02-18 DIAGNOSIS — R198 Other specified symptoms and signs involving the digestive system and abdomen: Secondary | ICD-10-CM | POA: Insufficient documentation

## 2021-02-19 ENCOUNTER — Ambulatory Visit (INDEPENDENT_AMBULATORY_CARE_PROVIDER_SITE_OTHER): Payer: Medicare Other

## 2021-02-19 ENCOUNTER — Other Ambulatory Visit: Payer: Self-pay

## 2021-02-19 VITALS — Ht 65.5 in | Wt 152.0 lb

## 2021-02-19 DIAGNOSIS — Z0000 Encounter for general adult medical examination without abnormal findings: Secondary | ICD-10-CM

## 2021-02-19 NOTE — Patient Instructions (Signed)
Natalie Browning , Thank you for taking time to come for your Medicare Wellness Visit. I appreciate your ongoing commitment to your health goals. Please review the following plan we discussed and let me know if I can assist you in the future.   Screening recommendations/referrals: Colonoscopy: Done 03/01/2011 Repeat in 10 years  Mammogram: Done 09/05/2020 Repeat annually  Bone Density: Done 08/15/2017   Recommended yearly ophthalmology/optometry visit for glaucoma screening and checkup Recommended yearly dental visit for hygiene and checkup  Vaccinations: Influenza vaccine: Done 01/22/2021  Pneumococcal vaccine: Done 03/10/2015 and 02/19/2008 Tdap vaccine: 09/01/2008 Shingles vaccine: Zoster 04/19/2011   Covid-19:Done 04/29/2019, 05/29/2019 and 02/21/2020.  Advanced directives: Advance directive discussed with you today. I have provided a copy for you to complete at home and have notarized. Once this is complete please bring a copy in to our office so we can scan it into your chart.   Conditions/risks identified: Aim for 30 minutes of exercise or brisk walking each day, drink 6-8 glasses of water and eat lots of fruits and vegetables.   Next appointment: Follow up in one year for your annual wellness visit 2023.   Preventive Care 77 Years and Older, Female Preventive care refers to lifestyle choices and visits with your health care provider that can promote health and wellness. What does preventive care include? A yearly physical exam. This is also called an annual well check. Dental exams once or twice a year. Routine eye exams. Ask your health care provider how often you should have your eyes checked. Personal lifestyle choices, including: Daily care of your teeth and gums. Regular physical activity. Eating a healthy diet. Avoiding tobacco and drug use. Limiting alcohol use. Practicing safe sex. Taking low-dose aspirin every day. Taking vitamin and mineral supplements as recommended by  your health care provider. What happens during an annual well check? The services and screenings done by your health care provider during your annual well check will depend on your age, overall health, lifestyle risk factors, and family history of disease. Counseling  Your health care provider may ask you questions about your: Alcohol use. Tobacco use. Drug use. Emotional well-being. Home and relationship well-being. Sexual activity. Eating habits. History of falls. Memory and ability to understand (cognition). Work and work Statistician. Reproductive health. Screening  You may have the following tests or measurements: Height, weight, and BMI. Blood pressure. Lipid and cholesterol levels. These may be checked every 5 years, or more frequently if you are over 25 years old. Skin check. Lung cancer screening. You may have this screening every year starting at age 37 if you have a 30-pack-year history of smoking and currently smoke or have quit within the past 15 years. Fecal occult blood test (FOBT) of the stool. You may have this test every year starting at age 46. Flexible sigmoidoscopy or colonoscopy. You may have a sigmoidoscopy every 5 years or a colonoscopy every 10 years starting at age 28. Hepatitis C blood test. Hepatitis B blood test. Sexually transmitted disease (STD) testing. Diabetes screening. This is done by checking your blood sugar (glucose) after you have not eaten for a while (fasting). You may have this done every 1-3 years. Bone density scan. This is done to screen for osteoporosis. You may have this done starting at age 34. Mammogram. This may be done every 1-2 years. Talk to your health care provider about how often you should have regular mammograms. Talk with your health care provider about your test results, treatment options, and if  necessary, the need for more tests. Vaccines  Your health care provider may recommend certain vaccines, such as: Influenza  vaccine. This is recommended every year. Tetanus, diphtheria, and acellular pertussis (Tdap, Td) vaccine. You may need a Td booster every 10 years. Zoster vaccine. You may need this after age 31. Pneumococcal 13-valent conjugate (PCV13) vaccine. One dose is recommended after age 70. Pneumococcal polysaccharide (PPSV23) vaccine. One dose is recommended after age 19. Talk to your health care provider about which screenings and vaccines you need and how often you need them. This information is not intended to replace advice given to you by your health care provider. Make sure you discuss any questions you have with your health care provider. Document Released: 05/01/2015 Document Revised: 12/23/2015 Document Reviewed: 02/03/2015 Elsevier Interactive Patient Education  2017 [employer] Prevention in the Home Falls can cause injuries. They can happen to people of all ages. There are many things you can do to make your home safe and to help prevent falls. What can I do on the outside of my home? Regularly fix the edges of walkways and driveways and fix any cracks. Remove anything that might make you trip as you walk through a door, such as a raised step or threshold. Trim any bushes or trees on the path to your home. Use bright outdoor lighting. Clear any walking paths of anything that might make someone trip, such as rocks or tools. Regularly check to see if handrails are loose or broken. Make sure that both sides of any steps have handrails. Any raised decks and porches should have guardrails on the edges. Have any leaves, snow, or ice cleared regularly. Use sand or salt on walking paths during winter. Clean up any spills in your garage right away. This includes oil or grease spills. What can I do in the bathroom? Use night lights. Install grab bars by the toilet and in the tub and shower. Do not use towel bars as grab bars. Use non-skid mats or decals in the tub or shower. If you  need to sit down in the shower, use a plastic, non-slip stool. Keep the floor dry. Clean up any water that spills on the floor as soon as it happens. Remove soap buildup in the tub or shower regularly. Attach bath mats securely with double-sided non-slip rug tape. Do not have throw rugs and other things on the floor that can make you trip. What can I do in the bedroom? Use night lights. Make sure that you have a light by your bed that is easy to reach. Do not use any sheets or blankets that are too big for your bed. They should not hang down onto the floor. Have a firm chair that has side arms. You can use this for support while you get dressed. Do not have throw rugs and other things on the floor that can make you trip. What can I do in the kitchen? Clean up any spills right away. Avoid walking on wet floors. Keep items that you use a lot in easy-to-reach places. If you need to reach something above you, use a strong step stool that has a grab bar. Keep electrical cords out of the way. Do not use floor polish or wax that makes floors slippery. If you must use wax, use non-skid floor wax. Do not have throw rugs and other things on the floor that can make you trip. What can I do with my stairs? Do not leave any items  on the stairs. Make sure that there are handrails on both sides of the stairs and use them. Fix handrails that are broken or loose. Make sure that handrails are as long as the stairways. Check any carpeting to make sure that it is firmly attached to the stairs. Fix any carpet that is loose or worn. Avoid having throw rugs at the top or bottom of the stairs. If you do have throw rugs, attach them to the floor with carpet tape. Make sure that you have a light switch at the top of the stairs and the bottom of the stairs. If you do not have them, ask someone to add them for you. What else can I do to help prevent falls? Wear shoes that: Do not have high heels. Have rubber  bottoms. Are comfortable and fit you well. Are closed at the toe. Do not wear sandals. If you use a stepladder: Make sure that it is fully opened. Do not climb a closed stepladder. Make sure that both sides of the stepladder are locked into place. Ask someone to hold it for you, if possible. Clearly mark and make sure that you can see: Any grab bars or handrails. First and last steps. Where the edge of each step is. Use tools that help you move around (mobility aids) if they are needed. These include: Canes. Walkers. Scooters. Crutches. Turn on the lights when you go into a dark area. Replace any light bulbs as soon as they burn out. Set up your furniture so you have a clear path. Avoid moving your furniture around. If any of your floors are uneven, fix them. If there are any pets around you, be aware of where they are. Review your medicines with your doctor. Some medicines can make you feel dizzy. This can increase your chance of falling. Ask your doctor what other things that you can do to help prevent falls. This information is not intended to replace advice given to you by your health care provider. Make sure you discuss any questions you have with your health care provider. Document Released: 01/29/2009 Document Revised: 09/10/2015 Document Reviewed: 05/09/2014 Elsevier Interactive Patient Education  2017 [employer].

## 2021-02-19 NOTE — Progress Notes (Signed)
Subjective:   Natalie Browning is a 75 y.o. female who presents for an Initial Medicare Annual Wellness Visit. Review of Systems     Cardiac Risk Factors include: advanced age (>37men, >53 women);sedentary lifestyle     Objective:    Today's Vitals   02/19/21 0814  Weight: 152 lb (68.9 kg)  Height: 5' 5.5" (1.664 m)   Body mass index is 24.91 kg/m.  Advanced Directives 02/19/2021  Does Patient Have a Medical Advance Directive? No  Would patient like information on creating a medical advance directive? No - Patient declined    Current Medications (verified) Outpatient Encounter Medications as of 02/19/2021  Medication Sig   Alpha-D-Galactosidase (BEANO PO) 1- 2 tablets   Calcium Carbonate-Vitamin D (CALCIUM + D PO) Take by mouth. Reported on 07/15/2015   carboxymethylcellulose (REFRESH PLUS) 0.5 % SOLN 2-3 drop into both eyes   Cholecalciferol (VITAMIN D) 125 MCG (5000 UT) CAPS 1 tablet   NONFORMULARY OR COMPOUNDED ITEM Triest 1.25 1 ml daily (formulated estrogen cream)   Omega-3 Fatty Acids (FISH OIL) 1000 MG CPDR 1 capsule   omeprazole (PRILOSEC) 20 MG capsule 1 capsule 30 minutes before morning meal   progesterone (PROMETRIUM) 200 MG capsule 1 capsule   RESTASIS 0.05 % ophthalmic emulsion    vitamin B-12 (CYANOCOBALAMIN) 1000 MCG tablet Take 1 tablet (1,000 mcg total) by mouth daily.   [DISCONTINUED] calcium carbonate (CALCIUM 600) 1500 (600 Ca) MG TABS tablet 1 tablet   [DISCONTINUED] Progesterone Micronized (PROGESTERONE, BULK,) POWD TAKE 1 CAPSULE EVERY DAY ON DAYS 1-12.   [DISCONTINUED] White Petrolatum-Mineral Oil (GENTEAL TEARS NIGHT-TIME) OINT See admin instructions.   No facility-administered encounter medications on file as of 02/19/2021.    Allergies (verified) Ciprofloxacin, Codeine, Macrobid [nitrofurantoin], and Simethicone   History: Past Medical History:  Diagnosis Date   Acid reflux    Allergy    Anemia    Cataract    Cervical dysplasia 1970's    Gastritis    Osteopenia 09/2013   T score -2.0 FRAX 15%/2.8% overall stable from prior DEXA 2012   Past Surgical History:  Procedure Laterality Date   cataract surg     COLPOSCOPY     DILATION AND CURETTAGE OF UTERUS  03/18/2010   Endometrial polyp   EYE SURGERY     Laser   GYNECOLOGIC CRYOSURGERY     NASAL SINUS SURGERY     TONSILLECTOMY     UPPER GASTROINTESTINAL ENDOSCOPY  2006   Family History  Problem Relation Age of Onset   Diabetes Mother    Heart disease Mother    Hypertension Mother    Heart disease Father    Hypertension Father    Cancer Sister        Non Hodgkin's   Social History   Socioeconomic History   Marital status: Married    Spouse name: Natalie Browning   Number of children: 0   Years of education: Not on file   Highest education level: Not on file  Occupational History   Not on file  Tobacco Use   Smoking status: Never   Smokeless tobacco: Never  Vaping Use   Vaping Use: Never used  Substance and Sexual Activity   Alcohol use: No    Alcohol/week: 0.0 standard drinks   Drug use: No   Sexual activity: Not Currently    Birth control/protection: Post-menopausal    Comment: 1st intercourse 72 yo-1 partner  Other Topics Concern   Not on file  Social History Narrative  Not on file   Social Determinants of Health   Financial Resource Strain: Low Risk    Difficulty of Paying Living Expenses: Not hard at all  Food Insecurity: No Food Insecurity   Worried About Charity fundraiser in the Last Year: Never true   Natalie Browning in the Last Year: Never true  Transportation Needs: No Transportation Needs   Lack of Transportation (Medical): No   Lack of Transportation (Non-Medical): No  Physical Activity: Sufficiently Active   Days of Exercise per Week: 5 days   Minutes of Exercise per Session: 30 min  Stress: No Stress Concern Present   Feeling of Stress : Only a little  Social Connections: Engineer, building services of Communication with Friends  and Family: More than three times a week   Frequency of Social Gatherings with Friends and Family: More than three times a week   Attends Religious Services: More than 4 times per year   Active Member of [employer] or Organizations: Yes   Attends Music therapist: More than 4 times per year   Marital Status: Married    Tobacco Counseling Counseling given: Not Answered   Clinical Intake:  Pre-visit preparation completed: Yes  Pain : No/denies pain     BMI - recorded: 24.91 Nutritional Status: BMI of 19-24  Normal Nutritional Risks: None Diabetes: No  How often do you need to have someone help you when you read instructions, pamphlets, or other written materials from your doctor or pharmacy?: 1 - Never  Diabetic?NO  Interpreter Needed?: No  Information entered by :: Natalie Joniyah Mallinger, LPN   Activities of Daily Living In your present state of health, do you have any difficulty performing the following activities: 02/19/2021  Hearing? Y  Vision? N  Difficulty concentrating or making decisions? Y  Walking or climbing stairs? N  Dressing or bathing? N  Doing errands, shopping? N  Preparing Food and eating ? N  Using the Toilet? N  In the past six months, have you accidently leaked urine? Y  Do you have problems with loss of bowel control? N  Managing your Medications? N  Managing your Finances? N  Housekeeping or managing your Housekeeping? N  Some recent data might be hidden    Patient Care Team: Susy Frizzle, MD as PCP - General (Family Medicine)  Indicate any recent Medical Services you may have received from other than [facility] providers in the past year (date may be approximate).     Assessment:   This is a routine wellness examination for Natalie Browning.  Hearing/Vision screen Hearing Screening - Comments:: Some hearing issues in a crowd per pt.  Vision Screening - Comments:: Lens implants, glasses for distance. Dr. Valetta Close, 2022.  Dietary issues and exercise  activities discussed: Current Exercise Habits: Home exercise routine, Type of exercise: walking, Time (Minutes): 30, Frequency (Times/Week): 5, Weekly Exercise (Minutes/Week): 150, Intensity: Mild, Exercise limited by: orthopedic condition(s)   Goals Addressed             This Visit's Progress    Exercise 3x per week (30 min per time)       Would like to do aerobic exercise.        Depression Screen PHQ 2/9 Scores 02/19/2021 10/30/2020 06/22/2017 07/04/2016 07/15/2015 08/06/2013  PHQ - 2 Score 0 0 0 0 0 0  PHQ- 9 Score - - - 0 0 -    Fall Risk Fall Risk  02/19/2021 10/30/2020 11/19/2018 06/22/2017 07/15/2015  Falls in the past year? 0 0 (No Data) No No  Comment - - Emmi Telephone Survey: data to providers prior to load - -  Number falls in past yr: 0 0 (No Data) - -  Comment - - Emmi Telephone Survey Actual Response =  - -  Injury with Fall? 0 0 - - -  Risk for fall due to : No Fall Risks Impaired balance/gait - - -  Follow up Falls prevention discussed Falls evaluation completed - - -    FALL RISK PREVENTION PERTAINING TO THE HOME:  Any stairs in or around the home? Yes  If so, are there any without handrails? No  Home free of loose throw rugs in walkways, pet beds, electrical cords, etc? Yes  Adequate lighting in your home to reduce risk of falls? Yes   ASSISTIVE DEVICES UTILIZED TO PREVENT FALLS:  Life alert? No  Use of a cane, walker or w/c? No  Grab bars in the bathroom? Yes  Shower chair or bench in shower? Yes  Elevated toilet seat or a handicapped toilet? Yes   TIMED UP AND GO:  Was the test performed? Yes .  Length of time to ambulate 10 feet: 7 sec.   Gait steady and fast without use of assistive device  Cognitive Function:     6CIT Screen 02/19/2021  What Year? 0 points  What month? 0 points  What time? 0 points  Count back from 20 0 points  Months in reverse 0 points  Repeat phrase 0 points  Total Score 0    Immunizations Immunization History   Administered Date(s) Administered   Fluad Quad(high Dose 65+) 01/17/2019, 01/22/2020, 01/22/2021   Influenza, High Dose Seasonal PF 01/26/2018   Influenza,inj,Quad PF,6+ Mos 02/08/2013, 01/09/2014, 01/22/2015, 01/19/2016   Influenza-Unspecified 01/26/2017   Moderna Covid-19 Vaccine Bivalent Booster 2yrs & up 02/21/2020   Moderna Sars-Covid-2 Vaccination 04/29/2019, 05/29/2019   Pneumococcal Conjugate-13 03/10/2015   Pneumococcal Polysaccharide-23 02/29/2008   Td 09/01/2008   Zoster, Live 04/19/2011    TDAP status: Due, Education has been provided regarding the importance of this vaccine. Advised may receive this vaccine at local pharmacy or Health Dept. Aware to provide a copy of the vaccination record if obtained from local pharmacy or Health Dept. Verbalized acceptance and understanding.  Flu Vaccine status: Up to date  Pneumococcal vaccine status: Up to date  Covid-19 vaccine status: Information provided on how to obtain vaccines.   Qualifies for Shingles Vaccine? Yes   Zostavax completed Yes   Shingrix Completed?: No.    Education has been provided regarding the importance of this vaccine. Patient has been advised to call insurance company to determine out of pocket expense if they have not yet received this vaccine. Advised may also receive vaccine at local pharmacy or Health Dept. Verbalized acceptance and understanding.  Screening Tests Health Maintenance  Topic Date Due   Hepatitis C Screening  Never done   Zoster Vaccines- Shingrix (1 of 2) Never done   Pneumonia Vaccine 30+ Years old (38) 03/09/2016   TETANUS/TDAP  09/02/2018   INFLUENZA VACCINE  Completed   DEXA SCAN  Completed   COVID-19 Vaccine  Completed   HPV VACCINES  Aged Out    Health Maintenance  Health Maintenance Due  Topic Date Due   Hepatitis C Screening  Never done   Zoster Vaccines- Shingrix (1 of 2) Never done   Pneumonia Vaccine 6+ Years old (3) 03/09/2016   TETANUS/TDAP  09/02/2018  Colorectal cancer screening: Type of screening: Colonoscopy. Completed 03/01/2011. Repeat every 10 years  Mammogram status: Completed 09/05/20. Repeat every year  Bone Density status: Completed 08/15/2017. Results reflect: Bone density results: OSTEOPENIA. Repeat every 2 years.  Lung Cancer Screening: (Low Dose CT Chest recommended if Age 23-80 years, 30 pack-year currently smoking OR have quit w/in 15years.) does not qualify.    Additional Screening:  Hepatitis C Screening: does qualify; Completed Not done yet.  Vision Screening: Recommended annual ophthalmology exams for early detection of glaucoma and other disorders of the eye. Is the patient up to date with their annual eye exam?  Yes  Who is the provider or what is the name of the office in which the patient attends annual eye exams? Dr. Valetta Close. If pt is not established with a provider, would they like to be referred to a provider to establish care? No .   Dental Screening: Recommended annual dental exams for proper oral hygiene  Community Resource Referral / Chronic Care Management: CRR required this visit?  No   CCM required this visit?  No      Plan:     I have personally reviewed and noted the following in the patient's chart:   Medical and social history Use of alcohol, tobacco or illicit drugs  Current medications and supplements including opioid prescriptions. Patient is not currently taking opioid prescriptions. Functional ability and status Nutritional status Physical activity Advanced directives List of other physicians Hospitalizations, surgeries, and ER visits in previous 12 months Vitals Screenings to include cognitive, depression, and falls Referrals and appointments  In addition, I have reviewed and discussed with patient certain preventive protocols, quality metrics, and best practice recommendations. A written personalized care plan for preventive services as well as general preventive health  recommendations were provided to patient.     Chriss Driver, LPN   [id number]   Nurse Notes: Pt states she would like to wait on scheduling Dexa and Colonoscopy. Up to date on vaccines. Discussed Shingles.

## 2021-04-01 ENCOUNTER — Encounter: Payer: Self-pay | Admitting: Internal Medicine

## 2021-04-01 DIAGNOSIS — D225 Melanocytic nevi of trunk: Secondary | ICD-10-CM | POA: Diagnosis not present

## 2021-04-01 DIAGNOSIS — X32XXXD Exposure to sunlight, subsequent encounter: Secondary | ICD-10-CM | POA: Diagnosis not present

## 2021-04-01 DIAGNOSIS — L570 Actinic keratosis: Secondary | ICD-10-CM | POA: Diagnosis not present

## 2021-04-01 DIAGNOSIS — L820 Inflamed seborrheic keratosis: Secondary | ICD-10-CM | POA: Diagnosis not present

## 2021-06-11 ENCOUNTER — Other Ambulatory Visit: Payer: Self-pay | Admitting: *Deleted

## 2021-06-11 DIAGNOSIS — Z79890 Hormone replacement therapy (postmenopausal): Secondary | ICD-10-CM

## 2021-06-11 NOTE — Telephone Encounter (Signed)
Gyn exam was 11/24/21

## 2021-06-14 DIAGNOSIS — H524 Presbyopia: Secondary | ICD-10-CM | POA: Diagnosis not present

## 2021-06-14 DIAGNOSIS — H04123 Dry eye syndrome of bilateral lacrimal glands: Secondary | ICD-10-CM | POA: Diagnosis not present

## 2021-06-14 MED ORDER — NONFORMULARY OR COMPOUNDED ITEM: 1 refills | Status: DC

## 2021-06-14 NOTE — Telephone Encounter (Signed)
Rx called it. Patient informed.

## 2021-08-03 ENCOUNTER — Telehealth: Payer: Self-pay

## 2021-08-03 NOTE — Telephone Encounter (Signed)
Pt called in stating that she knows it is time for her 10 yr colonoscopy. Pt states that she is unsure if she can just schedule this appt or if she needs to schedule an ov with pcp to get this appt. Please advise. ? ?Cb#: [phone] ?

## 2021-08-04 NOTE — Telephone Encounter (Signed)
I would go ahead and call pt to schedule an appt for annual exam in July and then she can discuss that with Dr. Dennard Schaumann. Unless she is experiencing other health issus that she needs to see him before.  ? ?Thanks ?

## 2021-08-06 ENCOUNTER — Other Ambulatory Visit: Payer: Self-pay | Admitting: Family Medicine

## 2021-08-06 DIAGNOSIS — Z1231 Encounter for screening mammogram for malignant neoplasm of breast: Secondary | ICD-10-CM

## 2021-08-12 NOTE — Telephone Encounter (Signed)
I have called and spoke with patient. She is going to contact the office that she went to last time for the colonoscopy, She was seen for a AWV in November last year with Karle Starch and is already scheduled for his year. I did offer an appointment to see Dr. Dennard Schaumann since she hasn't been seen since July 22 by him. She is going to make appointment for colonoscopy and then call us for an appointment.  ?

## 2021-09-01 ENCOUNTER — Other Ambulatory Visit: Payer: Self-pay | Admitting: Nurse Practitioner

## 2021-09-01 ENCOUNTER — Telehealth: Payer: Self-pay

## 2021-09-01 DIAGNOSIS — Z79890 Hormone replacement therapy (postmenopausal): Secondary | ICD-10-CM

## 2021-09-01 MED ORDER — NONFORMULARY OR COMPOUNDED ITEM: 0 refills | Status: DC

## 2021-09-01 NOTE — Telephone Encounter (Signed)
Rx called in to [facility]. ?

## 2021-09-01 NOTE — Telephone Encounter (Addendum)
Patient said [facility] no longer compounding Tri-Est Cream. She would like to change to Bi-Est cream. To be phoned in to [facility]. ?

## 2021-09-01 NOTE — Telephone Encounter (Signed)
Please call in Biestrogen cream 1.25 mg once daily (compounded estrogen cream) to [facility].  ?

## 2021-09-02 NOTE — Telephone Encounter (Signed)
Narda Rutherford, pharmacist at [facility] called with a question:  She said the Tri-Est was 8 parts Estriol, 1 part Estradiol and 1 part Estrone.  She questioned what ratio you want for the Biest. She recommended 8 parts Estriol and 2 parts Estradiol (to make up for the 1 part Estrone she is missing in this new Rx).   Ok?

## 2021-09-06 ENCOUNTER — Ambulatory Visit
Admission: RE | Admit: 2021-09-06 | Discharge: 2021-09-06 | Disposition: A | Discharge status: Home / Self Care | Payer: Medicare Other | Source: Ambulatory Visit | Attending: Family Medicine | Admitting: Family Medicine

## 2021-09-06 DIAGNOSIS — Z1231 Encounter for screening mammogram for malignant neoplasm of breast: Secondary | ICD-10-CM

## 2021-09-06 NOTE — Telephone Encounter (Signed)
Yes that ratio is fine. Thank you.

## 2021-09-06 NOTE — Telephone Encounter (Signed)
Left message in pharmacy voice mail that this ratio was fine.

## 2021-09-08 ENCOUNTER — Other Ambulatory Visit: Payer: Self-pay | Admitting: Family Medicine

## 2021-09-08 DIAGNOSIS — R928 Other abnormal and inconclusive findings on diagnostic imaging of breast: Secondary | ICD-10-CM

## 2021-09-14 ENCOUNTER — Ambulatory Visit
Admission: RE | Admit: 2021-09-14 | Discharge: 2021-09-14 | Disposition: A | Discharge status: Home / Self Care | Payer: Medicare Other | Source: Ambulatory Visit | Attending: Family Medicine | Admitting: Family Medicine

## 2021-09-14 ENCOUNTER — Ambulatory Visit: Payer: Medicare Other

## 2021-09-14 DIAGNOSIS — N6489 Other specified disorders of breast: Secondary | ICD-10-CM | POA: Diagnosis not present

## 2021-09-14 DIAGNOSIS — R928 Other abnormal and inconclusive findings on diagnostic imaging of breast: Secondary | ICD-10-CM | POA: Diagnosis not present

## 2021-12-03 ENCOUNTER — Ambulatory Visit (INDEPENDENT_AMBULATORY_CARE_PROVIDER_SITE_OTHER): Payer: Medicare Other | Admitting: Family Medicine

## 2021-12-03 VITALS — BP 124/62 | HR 70 | Temp 97.9°F | Wt 156.6 lb

## 2021-12-03 DIAGNOSIS — Z13220 Encounter for screening for lipoid disorders: Secondary | ICD-10-CM | POA: Diagnosis not present

## 2021-12-03 DIAGNOSIS — R7303 Prediabetes: Secondary | ICD-10-CM | POA: Diagnosis not present

## 2021-12-03 DIAGNOSIS — Z1211 Encounter for screening for malignant neoplasm of colon: Secondary | ICD-10-CM | POA: Diagnosis not present

## 2021-12-03 DIAGNOSIS — Z780 Asymptomatic menopausal state: Secondary | ICD-10-CM

## 2021-12-03 DIAGNOSIS — Z0000 Encounter for general adult medical examination without abnormal findings: Secondary | ICD-10-CM | POA: Diagnosis not present

## 2021-12-03 DIAGNOSIS — R799 Abnormal finding of blood chemistry, unspecified: Secondary | ICD-10-CM

## 2021-12-03 DIAGNOSIS — M8580 Other specified disorders of bone density and structure, unspecified site: Secondary | ICD-10-CM

## 2021-12-03 NOTE — Progress Notes (Signed)
Subjective:    Patient ID: Natalie Browning, female    DOB: [birth year], 76 y.o.   MRN: [id number]  HPI Patient is a very pleasant 76 year old Caucasian female here today for physical exam.  Her mammogram was performed earlier this year and was normal.  Her last colonoscopy was in 2012 and is overdue.  Despite being 64 she is in very good health and I do feel that she would benefit from another colonoscopy so we will schedule this.  Her last bone density test was in 2019 and did show osteopenia so we are due to repeat that.  Reviewing her immunizations, she has had both pneumonia vaccines as well as a shingles shot.  We discussed RSV vaccination, we discussed the flu shot, and we discussed the COVID booster.  Otherwise she is doing well with no concerns.  Lab work last year did show prediabetes and we discussed a low carbohydrate diet today.  Past Medical History:  Diagnosis Date   Acid reflux    Allergy    Anemia    Cataract    Cervical dysplasia 1970's   Gastritis    Osteopenia 09/2013   T score -2.0 FRAX 15%/2.8% overall stable from prior DEXA 2012   Past Surgical History:  Procedure Laterality Date   cataract surg     COLPOSCOPY     DILATION AND CURETTAGE OF UTERUS  03/18/2010   Endometrial polyp   EYE SURGERY     Laser   GYNECOLOGIC CRYOSURGERY     NASAL SINUS SURGERY     TONSILLECTOMY     UPPER GASTROINTESTINAL ENDOSCOPY  2006   Current Outpatient Medications on File Prior to Visit  Medication Sig Dispense Refill   Alpha-D-Galactosidase (BEANO PO) 1- 2 tablets     Calcium Carbonate-Vitamin D (CALCIUM + D PO) Take by mouth. Reported on 07/15/2015     carboxymethylcellulose (REFRESH PLUS) 0.5 % SOLN 2-3 drop into both eyes     Cholecalciferol (VITAMIN D) 125 MCG (5000 UT) CAPS 1 tablet     NONFORMULARY OR COMPOUNDED ITEM Biestrogen cream 1.25 mg once daily (formulated estrogen cream) 90 each 0   Omega-3 Fatty Acids (FISH OIL) 1000 MG CPDR 1 capsule     omeprazole (PRILOSEC) 20  MG capsule 1 capsule 30 minutes before morning meal     progesterone (PROMETRIUM) 200 MG capsule 1 capsule     RESTASIS 0.05 % ophthalmic emulsion      vitamin B-12 (CYANOCOBALAMIN) 1000 MCG tablet Take 1 tablet (1,000 mcg total) by mouth daily.     No current facility-administered medications on file prior to visit.   Marland Kitchenall Social History   Socioeconomic History   Marital status: Married    Spouse name: Sonia Side   Number of children: 0   Years of education: Not on file   Highest education level: Not on file  Occupational History   Not on file  Tobacco Use   Smoking status: Never   Smokeless tobacco: Never  Vaping Use   Vaping Use: Never used  Substance and Sexual Activity   Alcohol use: No    Alcohol/week: 0.0 standard drinks of alcohol   Drug use: No   Sexual activity: Not Currently    Birth control/protection: Post-menopausal    Comment: 1st intercourse 37 yo-1 partner  Other Topics Concern   Not on file  Social History Narrative   Not on file   Social Determinants of Health   Financial Resource Strain: Low Risk  (02/19/2021)  Overall Financial Resource Strain (CARDIA)    Difficulty of Paying Living Expenses: Not hard at all  Food Insecurity: No Food Insecurity (02/19/2021)   Hunger Vital Sign    Worried About Running Out of Food in the Last Year: Never true    Ran Out of Food in the Last Year: Never true  Transportation Needs: No Transportation Needs (02/19/2021)   PRAPARE - Hydrologist (Medical): No    Lack of Transportation (Non-Medical): No  Physical Activity: Sufficiently Active (02/19/2021)   Exercise Vital Sign    Days of Exercise per Week: 5 days    Minutes of Exercise per Session: 30 min  Stress: No Stress Concern Present (02/19/2021)   [place]    Feeling of Stress : Only a little  Social Connections: Socially Integrated (02/19/2021)   Social Connection and  Isolation Panel [NHANES]    Frequency of Communication with Friends and Family: More than three times a week    Frequency of Social Gatherings with Friends and Family: More than three times a week    Attends Religious Services: More than 4 times per year    Active Member of [employer] or Organizations: Yes    Attends Archivist Meetings: More than 4 times per year    Marital Status: Married  Human resources officer Violence: Not At Risk (02/19/2021)   Humiliation, Afraid, Rape, and Kick questionnaire    Fear of Current or Ex-Partner: No    Emotionally Abused: No    Physically Abused: No    Sexually Abused: No      Review of Systems  All other systems reviewed and are negative.      Objective:   Physical Exam Vitals reviewed.  Constitutional:      General: She is not in acute distress.    Appearance: Normal appearance. She is normal weight. She is not ill-appearing or toxic-appearing.  HENT:     Right Ear: Tympanic membrane and ear canal normal.     Left Ear: Tympanic membrane and ear canal normal.     Nose: No congestion or rhinorrhea.     Mouth/Throat:     Mouth: Mucous membranes are moist.     Pharynx: Oropharynx is clear. No oropharyngeal exudate or posterior oropharyngeal erythema.  Eyes:     Extraocular Movements: Extraocular movements intact.     Conjunctiva/sclera: Conjunctivae normal.     Pupils: Pupils are equal, round, and reactive to light.  Neck:     Vascular: No carotid bruit.  Cardiovascular:     Rate and Rhythm: Normal rate and regular rhythm.     Pulses: Normal pulses.     Heart sounds: Normal heart sounds. No murmur heard.    No friction rub. No gallop.  Pulmonary:     Effort: Pulmonary effort is normal. No respiratory distress.     Breath sounds: Normal breath sounds. No stridor. No wheezing, rhonchi or rales.  Abdominal:     General: Bowel sounds are normal. There is no distension.     Palpations: Abdomen is soft.     Tenderness: There is no  abdominal tenderness. There is no guarding or rebound.  Musculoskeletal:        General: No swelling.     Cervical back: Normal range of motion and neck supple. No rigidity or tenderness.     Right lower leg: No edema.     Left lower leg: No edema.  Lymphadenopathy:  Cervical: No cervical adenopathy.  Skin:    General: Skin is warm.     Coloration: Skin is not jaundiced or pale.     Findings: No bruising, erythema, lesion or rash.  Neurological:     General: No focal deficit present.     Mental Status: She is alert and oriented to person, place, and time. Mental status is at baseline.     Cranial Nerves: No cranial nerve deficit.     Sensory: No sensory deficit.     Motor: No weakness.     Coordination: Coordination normal.     Gait: Gait normal.     Deep Tendon Reflexes: Reflexes normal.  Psychiatric:        Mood and Affect: Mood normal.        Behavior: Behavior normal.        Thought Content: Thought content normal.        Judgment: Judgment normal.           Assessment & Plan:  General medical exam - Plan: CBC with Differential/Platelet, Lipid panel, COMPLETE METABOLIC PANEL WITH GFR  Osteopenia, unspecified location - Plan: CBC with Differential/Platelet, Lipid panel, COMPLETE METABOLIC PANEL WITH GFR, DG Bone Density  Screening cholesterol level - Plan: CBC with Differential/Platelet, Lipid panel, COMPLETE METABOLIC PANEL WITH GFR  Prediabetes - Plan: CBC with Differential/Platelet, Lipid panel, COMPLETE METABOLIC PANEL WITH GFR, Hemoglobin A1c  Abnormal finding of blood chemistry, unspecified - Plan: Lipid panel  Colon cancer screening - Plan: Ambulatory referral to Gastroenterology  Postmenopausal estrogen deficiency - Plan: DG Bone Density Patient's mammogram is up-to-date.  I will schedule her for colonoscopy as well as a bone density test.  Check CBC, CMP, fasting lipid panel.  Goal LDL cholesterol is less than 100.  Last year A1c was 6.4.  Continue to  monitor that.  Recommended a low carbohydrate diet.  Recommended a flu shot this fall as well as a COVID booster when it becomes available.  Discussed the pros and cons of the RSV vaccination.  I believe the patient is relatively low risk.

## 2021-12-04 LAB — LIPID PANEL
Cholesterol: 183 mg/dL (ref <200)
HDL: 80 mg/dL (ref >=50)
LDL Cholesterol (Calc): 83 mg/dL (calc)
Non-HDL Cholesterol (Calc): 103 mg/dL (calc) (ref <130)
Total CHOL/HDL Ratio: 2.3 (calc) (ref <5.0)
Triglycerides: 108 mg/dL (ref <150)

## 2021-12-04 LAB — CBC WITH DIFFERENTIAL/PLATELET
Absolute Monocytes: 302 cells/uL (ref 200–950)
Basophils Absolute: 21 cells/uL (ref 0–200)
Basophils Relative: 0.4 %
Eosinophils Absolute: 154 cells/uL (ref 15–500)
Eosinophils Relative: 2.9 %
HCT: 41.4 % (ref 35.0–45.0)
Hemoglobin: 13.7 g/dL (ref 11.7–15.5)
Lymphs Abs: 1919 cells/uL (ref 850–3900)
MCH: 30 pg (ref 27.0–33.0)
MCHC: 33.1 g/dL (ref 32.0–36.0)
MCV: 90.8 fL (ref 80.0–100.0)
MPV: 10.7 fL (ref 7.5–12.5)
Monocytes Relative: 5.7 %
Neutro Abs: 2904 cells/uL (ref 1500–7800)
Neutrophils Relative %: 54.8 %
Platelets: 260 10*3/uL (ref 140–400)
RBC: 4.56 10*6/uL (ref 3.80–5.10)
RDW: 12.1 % (ref 11.0–15.0)
Total Lymphocyte: 36.2 %
WBC: 5.3 10*3/uL (ref 3.8–10.8)

## 2021-12-04 LAB — COMPLETE METABOLIC PANEL WITH GFR
AG Ratio: 1.8 (calc) (ref 1.0–2.5)
ALT: 17 U/L (ref 6–29)
AST: 22 U/L (ref 10–35)
Albumin: 4.3 g/dL (ref 3.6–5.1)
Alkaline phosphatase (APISO): 87 U/L (ref 37–153)
BUN: 17 mg/dL (ref 7–25)
CO2: 26 mmol/L (ref 20–32)
Calcium: 9.7 mg/dL (ref 8.6–10.4)
Chloride: 103 mmol/L (ref 98–110)
Creat: 0.99 mg/dL (ref 0.60–1.00)
Globulin: 2.4 g/dL (calc) (ref 1.9–3.7)
Glucose, Bld: 121 mg/dL — ABNORMAL HIGH (ref 65–99)
Potassium: 4.6 mmol/L (ref 3.5–5.3)
Sodium: 138 mmol/L (ref 135–146)
Total Bilirubin: 0.8 mg/dL (ref 0.2–1.2)
Total Protein: 6.7 g/dL (ref 6.1–8.1)
eGFR: 58 mL/min/{1.73_m2} — ABNORMAL LOW (ref >=60)

## 2021-12-04 LAB — HEMOGLOBIN A1C
Hgb A1c MFr Bld: 6.5 % of total Hgb — ABNORMAL HIGH (ref <5.7)
Mean Plasma Glucose: 140 mg/dL
eAG (mmol/L): 7.7 mmol/L

## 2021-12-27 ENCOUNTER — Ambulatory Visit (INDEPENDENT_AMBULATORY_CARE_PROVIDER_SITE_OTHER): Payer: Medicare Other | Admitting: Family Medicine

## 2021-12-27 VITALS — BP 118/70 | HR 61 | Ht 65.0 in | Wt 156.6 lb

## 2021-12-27 DIAGNOSIS — M25561 Pain in right knee: Secondary | ICD-10-CM | POA: Diagnosis not present

## 2021-12-27 NOTE — Progress Notes (Signed)
Subjective:    Patient ID: Natalie Browning, female    DOB: 09-21-1945, 76 y.o.   MRN: [id number]  HPI Over the weekend, the patient was on her hands and knees trying to vacuum underneath the bed.  She felt sudden pain in her right knee and she was unable to rise and get up from a kneeling position due to the severe pain.  The pain is located over the lateral joint line.  She is extremely tender to palpation over the lateral joint line.  There is no effusion.  There is no erythema.  There is no warmth.  There is no bruising.  However she has significant pain with Apley grind. Past Medical History:  Diagnosis Date   Acid reflux    Allergy    Anemia    Cataract    Cervical dysplasia 1970's   Gastritis    Osteopenia 09/2013   T score -2.0 FRAX 15%/2.8% overall stable from prior DEXA 2012   Past Surgical History:  Procedure Laterality Date   cataract surg     COLPOSCOPY     DILATION AND CURETTAGE OF UTERUS  03/18/2010   Endometrial polyp   EYE SURGERY     Laser   GYNECOLOGIC CRYOSURGERY     NASAL SINUS SURGERY     TONSILLECTOMY     UPPER GASTROINTESTINAL ENDOSCOPY  2006   Current Outpatient Medications on File Prior to Visit  Medication Sig Dispense Refill   Alpha-D-Galactosidase (BEANO PO) 1- 2 tablets (Patient not taking: Reported on 12/03/2021)     Calcium Carbonate-Vitamin D (CALCIUM + D PO) Take by mouth. Reported on 07/15/2015     carboxymethylcellulose (REFRESH PLUS) 0.5 % SOLN 2-3 drop into both eyes     Cholecalciferol (VITAMIN D) 125 MCG (5000 UT) CAPS 1 tablet (Patient not taking: Reported on 12/03/2021)     NONFORMULARY OR COMPOUNDED ITEM Biestrogen cream 1.25 mg once daily (formulated estrogen cream) 90 each 0   Omega-3 Fatty Acids (FISH OIL) 1000 MG CPDR 1 capsule (Patient not taking: Reported on 12/03/2021)     omeprazole (PRILOSEC) 20 MG capsule 1 capsule 30 minutes before morning meal (Patient not taking: Reported on 12/03/2021)     progesterone (PROMETRIUM) 200 MG capsule  1 capsule     RESTASIS 0.05 % ophthalmic emulsion  (Patient not taking: Reported on 12/03/2021)     vitamin B-12 (CYANOCOBALAMIN) 1000 MCG tablet Take 1 tablet (1,000 mcg total) by mouth daily. (Patient not taking: Reported on 12/03/2021)     No current facility-administered medications on file prior to visit.   Allergies  Allergen Reactions   Ciprofloxacin    Codeine Itching and Nausea Only   Macrobid [Nitrofurantoin] Nausea And Vomiting   Simethicone Nausea Only    Social History   Socioeconomic History   Marital status: Married    Spouse name: Sonia Side   Number of children: 0   Years of education: Not on file   Highest education level: Not on file  Occupational History   Not on file  Tobacco Use   Smoking status: Never   Smokeless tobacco: Never  Vaping Use   Vaping Use: Never used  Substance and Sexual Activity   Alcohol use: No    Alcohol/week: 0.0 standard drinks of alcohol   Drug use: No   Sexual activity: Not Currently    Birth control/protection: Post-menopausal    Comment: 1st intercourse 30 yo-1 partner  Other Topics Concern   Not on file  Social History  Narrative   Not on file   Social Determinants of Health   Financial Resource Strain: Low Risk  (02/19/2021)   Overall Financial Resource Strain (CARDIA)    Difficulty of Paying Living Expenses: Not hard at all  Food Insecurity: No Food Insecurity (02/19/2021)   Hunger Vital Sign    Worried About Running Out of Food in the Last Year: Never true    Ran Out of Food in the Last Year: Never true  Transportation Needs: No Transportation Needs (02/19/2021)   PRAPARE - Hydrologist (Medical): No    Lack of Transportation (Non-Medical): No  Physical Activity: Sufficiently Active (02/19/2021)   Exercise Vital Sign    Days of Exercise per Week: 5 days    Minutes of Exercise per Session: 30 min  Stress: No Stress Concern Present (02/19/2021)   [place]    Feeling of Stress : Only a little  Social Connections: Socially Integrated (02/19/2021)   Social Connection and Isolation Panel [NHANES]    Frequency of Communication with Friends and Family: More than three times a week    Frequency of Social Gatherings with Friends and Family: More than three times a week    Attends Religious Services: More than 4 times per year    Active Member of [employer] or Organizations: Yes    Attends Archivist Meetings: More than 4 times per year    Marital Status: Married  Human resources officer Violence: Not At Risk (02/19/2021)   Humiliation, Afraid, Rape, and Kick questionnaire    Fear of Current or Ex-Partner: No    Emotionally Abused: No    Physically Abused: No    Sexually Abused: No      Review of Systems  All other systems reviewed and are negative.      Objective:   Physical Exam Vitals reviewed.  Constitutional:      Appearance: Normal appearance. She is normal weight.  Cardiovascular:     Rate and Rhythm: Normal rate and regular rhythm.     Pulses: Normal pulses.     Heart sounds: Normal heart sounds.  Musculoskeletal:     Right knee: No swelling, deformity, effusion or erythema. Decreased range of motion. Tenderness present over the lateral joint line. No LCL laxity, MCL laxity, ACL laxity or PCL laxity. Abnormal meniscus. Normal alignment.  Neurological:     General: No focal deficit present.     Mental Status: She is alert and oriented to person, place, and time. Mental status is at baseline.     Cranial Nerves: No cranial nerve deficit.     Sensory: No sensory deficit.     Motor: No weakness.     Coordination: Coordination normal.     Gait: Gait normal.     Deep Tendon Reflexes: Reflexes normal.           Assessment & Plan:  Acute pain of right knee  Based on her history and physical exam I suspect a meniscal tear.  Using sterile technique, I injected the right knee with 2 cc  lidocaine, 2 cc of Marcaine, and 2 cc of 40 mg/mL Kenalog.  The patient tolerated the procedure well without complication.  I recommended that she wear a knee brace, stay off the knee is much as possible, can ice and elevate the knee.

## 2022-01-18 ENCOUNTER — Other Ambulatory Visit: Payer: Self-pay

## 2022-01-18 DIAGNOSIS — Z79890 Hormone replacement therapy (postmenopausal): Secondary | ICD-10-CM

## 2022-01-18 MED ORDER — NONFORMULARY OR COMPOUNDED ITEM: 0 refills | Status: DC

## 2022-01-18 NOTE — Telephone Encounter (Signed)
Rx phoned in to pharmacy.   Message sent to appt desk to call and let her know that she needs exam for addtl refills and to assist her with arranging appt.

## 2022-01-18 NOTE — Telephone Encounter (Signed)
AEX 11/24/20. Not scheduled.

## 2022-01-25 ENCOUNTER — Other Ambulatory Visit: Payer: Self-pay | Admitting: *Deleted

## 2022-01-25 ENCOUNTER — Other Ambulatory Visit: Payer: Self-pay | Admitting: Nurse Practitioner

## 2022-01-25 ENCOUNTER — Telehealth: Payer: Self-pay

## 2022-01-25 MED ORDER — PROGESTERONE 200 MG PO CAPS: ORAL_CAPSULE | ORAL | 0 refills | Status: DC

## 2022-01-25 NOTE — Telephone Encounter (Signed)
[name] called about refill. Rx called into [facility].

## 2022-01-25 NOTE — Telephone Encounter (Signed)
Medication refill request: progesterone '200mg'$   Last AEX:  11-24-20 TW Next AEX: 02-02-22  Last MMG (if hormonal medication request): 09-14-21 density B/BIRADS 1 negative  Refill authorized: Please advise

## 2022-01-25 NOTE — Telephone Encounter (Signed)
Pt states she is having a "popping" sound in her knee. Pt asks if she could possibly have and MRI done to find out what is going on. Pt states it is not hurtling like it was at her last visit.

## 2022-01-27 ENCOUNTER — Other Ambulatory Visit: Payer: Self-pay | Admitting: Family Medicine

## 2022-01-27 DIAGNOSIS — S83206D Unspecified tear of unspecified meniscus, current injury, right knee, subsequent encounter: Secondary | ICD-10-CM

## 2022-02-01 ENCOUNTER — Ambulatory Visit: Payer: Medicare Other

## 2022-02-01 DIAGNOSIS — Z23 Encounter for immunization: Secondary | ICD-10-CM

## 2022-02-02 ENCOUNTER — Telehealth: Payer: Self-pay | Admitting: Family Medicine

## 2022-02-02 ENCOUNTER — Encounter: Payer: Self-pay | Admitting: Nurse Practitioner

## 2022-02-02 ENCOUNTER — Ambulatory Visit (INDEPENDENT_AMBULATORY_CARE_PROVIDER_SITE_OTHER): Payer: Medicare Other | Admitting: Nurse Practitioner

## 2022-02-02 DIAGNOSIS — Z79890 Hormone replacement therapy (postmenopausal): Secondary | ICD-10-CM | POA: Diagnosis not present

## 2022-02-02 MED ORDER — PROGESTERONE MICRONIZED 100 MG PO CAPS: 100.0000 mg | ORAL_CAPSULE | Freq: Every day | ORAL | 3 refills | Status: DC

## 2022-02-02 NOTE — Progress Notes (Signed)
   Acute Office Visit  Subjective:    Patient ID: Natalie Browning, female    DOB: 1945/06/07, 76 y.o.   MRN: [id number]   HPI 76 y.o. presents today for medication management. On hormone replacement therapy.  She is using 2 pumps of Triest estrogen cream daily instead of 4. She has tried to wean but does not tolerate night sweats. Taking Prometrium 200 mg 12 days of the month. No bleeding. UTD on mammogram.    Review of Systems  Constitutional: Negative.   Genitourinary: Negative.        Objective:    Physical Exam Constitutional:      Appearance: Normal appearance.   GU: Not indicated  BP 118/82   Pulse 65   Ht '5\' 5"'$  (1.651 m)   Wt 154 lb (69.9 kg)   SpO2 98%   BMI 25.63 kg/m  Wt Readings from Last 3 Encounters:  02/02/22 154 lb (69.9 kg)  12/27/21 156 lb 9.6 oz (71 kg)  12/03/21 156 lb 9.6 oz (71 kg)         Assessment & Plan:   Problem List Items Addressed This Visit   None Visit Diagnoses     Postmenopausal hormone therapy       Relevant Medications   progesterone (PROMETRIUM) 100 MG capsule      Plan: Discussed risk of continued use. She would like to continue. Recommend taking Prometrium nightly instead of cyclically. Will decrease to 100 mg. Will continue 2 pumps of Triest cream daily. Will return in 1 year for breast and pelvic exam or sooner if needed.      Tamela Gammon DNP, 10:22 AM 02/02/2022

## 2022-02-02 NOTE — Telephone Encounter (Signed)
Called patient to reschedule appt for Medicare AWV; patient declined and asked for appt to be canceled altogether since she's had too many appointments in the recent past.

## 2022-02-04 ENCOUNTER — Other Ambulatory Visit (INDEPENDENT_AMBULATORY_CARE_PROVIDER_SITE_OTHER): Payer: Medicare Other

## 2022-02-04 DIAGNOSIS — Z23 Encounter for immunization: Secondary | ICD-10-CM

## 2022-02-16 ENCOUNTER — Ambulatory Visit
Admission: RE | Admit: 2022-02-16 | Discharge: 2022-02-16 | Disposition: A | Discharge status: Home / Self Care | Payer: Medicare Other | Source: Ambulatory Visit | Attending: Family Medicine | Admitting: Family Medicine

## 2022-02-16 DIAGNOSIS — S83206D Unspecified tear of unspecified meniscus, current injury, right knee, subsequent encounter: Secondary | ICD-10-CM

## 2022-02-16 DIAGNOSIS — M25561 Pain in right knee: Secondary | ICD-10-CM | POA: Diagnosis not present

## 2022-02-23 ENCOUNTER — Encounter: Payer: Medicare Other | Admitting: Family Medicine

## 2022-04-22 DIAGNOSIS — M25561 Pain in right knee: Secondary | ICD-10-CM | POA: Diagnosis not present

## 2022-04-22 DIAGNOSIS — M2241 Chondromalacia patellae, right knee: Secondary | ICD-10-CM | POA: Diagnosis not present

## 2022-04-25 DIAGNOSIS — M2241 Chondromalacia patellae, right knee: Secondary | ICD-10-CM | POA: Insufficient documentation

## 2022-05-02 DIAGNOSIS — G8929 Other chronic pain: Secondary | ICD-10-CM | POA: Insufficient documentation

## 2022-05-03 DIAGNOSIS — M25561 Pain in right knee: Secondary | ICD-10-CM | POA: Diagnosis not present

## 2022-05-06 DIAGNOSIS — M25561 Pain in right knee: Secondary | ICD-10-CM | POA: Diagnosis not present

## 2022-05-10 DIAGNOSIS — M25561 Pain in right knee: Secondary | ICD-10-CM | POA: Diagnosis not present

## 2022-05-12 ENCOUNTER — Other Ambulatory Visit: Payer: Self-pay

## 2022-05-12 DIAGNOSIS — Z79890 Hormone replacement therapy (postmenopausal): Secondary | ICD-10-CM

## 2022-05-12 MED ORDER — NONFORMULARY OR COMPOUNDED ITEM: 2 refills | Status: DC

## 2022-05-12 NOTE — Telephone Encounter (Signed)
Spoke with [facility] and Rx called in.

## 2022-05-12 NOTE — Telephone Encounter (Signed)
Yes, please call in. Thank you.

## 2022-05-12 NOTE — Telephone Encounter (Signed)
Last AEX 11/24/2020--pt is low risk medicare, was seen in 01/2021 for med check and recall is in place for B&P to be scheduled in 01/2023. Last mammo-09/06/2021-birads 0, Dx Rt 09/14/2021-neg birads 1  OK to call in?

## 2022-05-13 DIAGNOSIS — M25561 Pain in right knee: Secondary | ICD-10-CM | POA: Diagnosis not present

## 2022-05-16 DIAGNOSIS — M25561 Pain in right knee: Secondary | ICD-10-CM | POA: Diagnosis not present

## 2022-05-18 DIAGNOSIS — M25561 Pain in right knee: Secondary | ICD-10-CM | POA: Diagnosis not present

## 2022-05-30 ENCOUNTER — Other Ambulatory Visit: Payer: Medicare Other

## 2022-07-01 DIAGNOSIS — H04123 Dry eye syndrome of bilateral lacrimal glands: Secondary | ICD-10-CM | POA: Diagnosis not present

## 2022-07-05 ENCOUNTER — Other Ambulatory Visit: Payer: Medicare Other

## 2022-07-22 NOTE — Telephone Encounter (Signed)
FYI

## 2022-10-21 ENCOUNTER — Ambulatory Visit (INDEPENDENT_AMBULATORY_CARE_PROVIDER_SITE_OTHER): Payer: Medicare Other | Admitting: Family Medicine

## 2022-10-21 ENCOUNTER — Encounter: Payer: Self-pay | Admitting: Family Medicine

## 2022-10-21 VITALS — BP 124/82 | HR 67 | Ht 65.0 in | Wt 157.0 lb

## 2022-10-21 DIAGNOSIS — S83206D Unspecified tear of unspecified meniscus, current injury, right knee, subsequent encounter: Secondary | ICD-10-CM

## 2022-10-21 NOTE — Progress Notes (Signed)
Subjective:    Patient ID: Natalie Browning, female    DOB: 10/26/1945, 77 y.o.   MRN: [id number]  HPI MRI 11/23: IMPRESSION: 1. Medial and lateral meniscal tears as described above. 2. Intact ligamentous structures and no acute bony findings. 3. Tricompartmental degenerative changes most significant in the patellofemoral joint. 4. No joint effusion or Baker's cyst. 5. Fluid on both sides of the iliotibial band suggesting iliotibial band syndrome.  Patient presents today complaining of pain in her right knee.  She is having a difficult time standing from a seated position.  It hurts over both joint lines when she is walking.  She states that she saw orthopedics but they recommended against surgery as long as she can handle the pain.  She states that the last cortisone injection lasted until about 1 month ago.  This is an injection she received in September. Past Medical History:  Diagnosis Date   Acid reflux    Allergy    Anemia    Cataract    Cervical dysplasia 1970's   Gastritis    Osteopenia 09/2013   T score -2.0 FRAX 15%/2.8% overall stable from prior DEXA 2012   Past Surgical History:  Procedure Laterality Date   cataract surg     COLPOSCOPY     DILATION AND CURETTAGE OF UTERUS  03/18/2010   Endometrial polyp   EYE SURGERY     Laser   GYNECOLOGIC CRYOSURGERY     NASAL SINUS SURGERY     TONSILLECTOMY     UPPER GASTROINTESTINAL ENDOSCOPY  2006   Current Outpatient Medications on File Prior to Visit  Medication Sig Dispense Refill   Calcium Carbonate-Vitamin D (CALCIUM + D PO) Take by mouth. Reported on 07/15/2015     carboxymethylcellulose (REFRESH PLUS) 0.5 % SOLN 2-3 drop into both eyes     Cholecalciferol (VITAMIN D) 125 MCG (5000 UT) CAPS      MAGNESIUM PO Take by mouth. PRN     NONFORMULARY OR COMPOUNDED ITEM Biestrogen cream 1.25 mg once daily (formulated estrogen cream) Sig: Apply 2 clicks or 0.55mL daily.  Provide enough refills until AEX due in 01/2023 (~120mL  w/ 2 refills) 180 each 2   progesterone (PROMETRIUM) 100 MG capsule Take 1 capsule (100 mg total) by mouth daily. 90 capsule 3   RESTASIS 0.05 % ophthalmic emulsion      vitamin B-12 (CYANOCOBALAMIN) 1000 MCG tablet Take 1 tablet (1,000 mcg total) by mouth daily.     No current facility-administered medications on file prior to visit.   Allergies  Allergen Reactions   Ciprofloxacin    Codeine Itching and Nausea Only   Macrobid [Nitrofurantoin] Nausea And Vomiting   Simethicone Nausea Only    Social History   Socioeconomic History   Marital status: Married    Spouse name: Dorene Sorrow   Number of children: 0   Years of education: Not on file   Highest education level: Not on file  Occupational History   Not on file  Tobacco Use   Smoking status: Never   Smokeless tobacco: Never  Vaping Use   Vaping Use: Never used  Substance and Sexual Activity   Alcohol use: No    Alcohol/week: 0.0 standard drinks of alcohol   Drug use: No   Sexual activity: Not Currently    Birth control/protection: Post-menopausal    Comment: 1st intercourse 70 yo-1 partner  Other Topics Concern   Not on file  Social History Narrative   Not on file  Social Determinants of Health   Financial Resource Strain: Low Risk  (02/19/2021)   Overall Financial Resource Strain (CARDIA)    Difficulty of Paying Living Expenses: Not hard at all  Food Insecurity: No Food Insecurity (02/19/2021)   Hunger Vital Sign    Worried About Running Out of Food in the Last Year: Never true    Ran Out of Food in the Last Year: Never true  Transportation Needs: No Transportation Needs (02/19/2021)   PRAPARE - Administrator, Civil Service (Medical): No    Lack of Transportation (Non-Medical): No  Physical Activity: Sufficiently Active (02/19/2021)   Exercise Vital Sign    Days of Exercise per Week: 5 days    Minutes of Exercise per Session: 30 min  Stress: No Stress Concern Present (02/19/2021)   [employer] of  Occupational Health - Occupational Stress Questionnaire    Feeling of Stress : Only a little  Social Connections: Socially Integrated (02/19/2021)   Social Connection and Isolation Panel [NHANES]    Frequency of Communication with Friends and Family: More than three times a week    Frequency of Social Gatherings with Friends and Family: More than three times a week    Attends Religious Services: More than 4 times per year    Active Member of [employer] or Organizations: Yes    Attends Banker Meetings: More than 4 times per year    Marital Status: Married  Catering manager Violence: Not At Risk (02/19/2021)   Humiliation, Afraid, Rape, and Kick questionnaire    Fear of Current or Ex-Partner: No    Emotionally Abused: No    Physically Abused: No    Sexually Abused: No      Review of Systems  All other systems reviewed and are negative.      Objective:   Physical Exam Vitals reviewed.  Constitutional:      Appearance: Normal appearance. She is normal weight.  Cardiovascular:     Rate and Rhythm: Normal rate and regular rhythm.     Pulses: Normal pulses.     Heart sounds: Normal heart sounds.  Musculoskeletal:     Right knee: No swelling, deformity, effusion or erythema. Decreased range of motion. Tenderness present over the lateral joint line. No LCL laxity, MCL laxity, ACL laxity or PCL laxity. Abnormal meniscus. Normal alignment.  Neurological:     General: No focal deficit present.     Mental Status: She is alert and oriented to person, place, and time. Mental status is at baseline.     Cranial Nerves: No cranial nerve deficit.     Sensory: No sensory deficit.     Motor: No weakness.     Coordination: Coordination normal.     Gait: Gait normal.     Deep Tendon Reflexes: Reflexes normal.           Assessment & Plan:  Tear of meniscus of right knee as current injury, unspecified meniscus, unspecified tear type, subsequent encounter  Using sterile technique,  I injected the right knee with 2 cc lidocaine, 2 cc of Marcaine, and 2 cc of 40 mg/mL Kenalog.  The patient tolerated the procedure well without complication.

## 2022-10-24 DIAGNOSIS — D131 Benign neoplasm of stomach: Secondary | ICD-10-CM | POA: Diagnosis not present

## 2022-10-24 DIAGNOSIS — K219 Gastro-esophageal reflux disease without esophagitis: Secondary | ICD-10-CM | POA: Diagnosis not present

## 2022-10-24 DIAGNOSIS — Z1211 Encounter for screening for malignant neoplasm of colon: Secondary | ICD-10-CM | POA: Diagnosis not present

## 2022-11-10 DIAGNOSIS — S83206D Unspecified tear of unspecified meniscus, current injury, right knee, subsequent encounter: Secondary | ICD-10-CM | POA: Diagnosis not present

## 2022-11-10 MED ORDER — TRIAMCINOLONE ACETONIDE 40 MG/ML IJ SUSP
40.0000 mg | Freq: Once | INTRAMUSCULAR | Status: AC
Start: 2022-11-10 — End: 2022-11-10
  Administered 2022-11-10: 40 mg via INTRA_ARTICULAR

## 2022-11-10 MED ORDER — BUPIVACAINE HCL 0.25 % IJ SOLN
2.0000 mL | Freq: Once | INTRAMUSCULAR | Status: AC
  Administered 2022-11-10: 2 mL via INTRA_ARTICULAR

## 2022-11-10 MED ORDER — TRIAMCINOLONE ACETONIDE 40 MG/ML IJ SUSP: 40.0000 mg | Freq: Once | INTRAMUSCULAR | Status: DC

## 2022-11-10 MED ORDER — LIDOCAINE HCL (PF) 1 % IJ SOLN
2.0000 mL | Freq: Once | INTRAMUSCULAR | Status: AC
  Administered 2022-11-10: 2 mL

## 2022-11-10 NOTE — Addendum Note (Signed)
Addended by: Venia Carbon K on: 11/10/2022 04:47 PM   Modules accepted: Orders

## 2022-11-25 ENCOUNTER — Ambulatory Visit
Admission: RE | Admit: 2022-11-25 | Discharge: 2022-11-25 | Disposition: A | Discharge status: Home / Self Care | Payer: Medicare Other | Source: Ambulatory Visit | Attending: Family Medicine | Admitting: Family Medicine

## 2022-11-25 DIAGNOSIS — E349 Endocrine disorder, unspecified: Secondary | ICD-10-CM | POA: Diagnosis not present

## 2022-11-25 DIAGNOSIS — N958 Other specified menopausal and perimenopausal disorders: Secondary | ICD-10-CM | POA: Diagnosis not present

## 2022-11-25 DIAGNOSIS — M8588 Other specified disorders of bone density and structure, other site: Secondary | ICD-10-CM | POA: Diagnosis not present

## 2022-11-25 DIAGNOSIS — M8580 Other specified disorders of bone density and structure, unspecified site: Secondary | ICD-10-CM

## 2022-11-25 DIAGNOSIS — Z780 Asymptomatic menopausal state: Secondary | ICD-10-CM

## 2023-01-09 DIAGNOSIS — K648 Other hemorrhoids: Secondary | ICD-10-CM | POA: Diagnosis not present

## 2023-01-09 DIAGNOSIS — K5730 Diverticulosis of large intestine without perforation or abscess without bleeding: Secondary | ICD-10-CM | POA: Diagnosis not present

## 2023-01-09 DIAGNOSIS — Z1211 Encounter for screening for malignant neoplasm of colon: Secondary | ICD-10-CM | POA: Diagnosis not present

## 2023-01-09 LAB — HM COLONOSCOPY

## 2023-01-13 ENCOUNTER — Ambulatory Visit (INDEPENDENT_AMBULATORY_CARE_PROVIDER_SITE_OTHER): Payer: Medicare Other | Admitting: Family Medicine

## 2023-01-13 ENCOUNTER — Encounter: Payer: Self-pay | Admitting: Family Medicine

## 2023-01-13 VITALS — BP 120/72 | HR 66 | Temp 97.5°F | Ht 65.0 in | Wt 155.2 lb

## 2023-01-13 DIAGNOSIS — M79671 Pain in right foot: Secondary | ICD-10-CM

## 2023-01-13 DIAGNOSIS — Z23 Encounter for immunization: Secondary | ICD-10-CM

## 2023-01-13 NOTE — Addendum Note (Signed)
Addended by: Arta Silence on: 01/13/2023 11:53 AM   Modules accepted: Orders

## 2023-01-13 NOTE — Progress Notes (Signed)
Subjective:    Patient ID: Natalie Browning, female    DOB: 1945/12/25, 77 y.o.   MRN: [id number]  HPI Patient is a very pleasant 77 year old Caucasian female.  Recently she was at walking in the sand.  She strained her longitudinal arch.  She reports pain in the midfoot directly in the middle of her arch.  She denies any pain at the bases of the metatarsals.  She denies any tenderness or pain at the calcaneus.  There is no significant swelling or bruising or erythema today seen in the midfoot.  There is no bony tenderness.  She states last week it was severe but it is gradually gotten better. Past Medical History:  Diagnosis Date   Acid reflux    Allergy    Anemia    Cataract    Cervical dysplasia 1970's   Gastritis    Osteopenia 09/2013   T score -2.0 FRAX 15%/2.8% overall stable from prior DEXA 2012   Past Surgical History:  Procedure Laterality Date   cataract surg     COLPOSCOPY     DILATION AND CURETTAGE OF UTERUS  03/18/2010   Endometrial polyp   EYE SURGERY     Laser   GYNECOLOGIC CRYOSURGERY     NASAL SINUS SURGERY     TONSILLECTOMY     UPPER GASTROINTESTINAL ENDOSCOPY  2006   Current Outpatient Medications on File Prior to Visit  Medication Sig Dispense Refill   Calcium Carbonate-Vitamin D (CALCIUM + D PO) Take by mouth. Reported on 07/15/2015     carboxymethylcellulose (REFRESH PLUS) 0.5 % SOLN 2-3 drop into both eyes     Cholecalciferol (VITAMIN D) 125 MCG (5000 UT) CAPS      MAGNESIUM PO Take by mouth. PRN     NONFORMULARY OR COMPOUNDED ITEM Biestrogen cream 1.25 mg once daily (formulated estrogen cream) Sig: Apply 2 clicks or 0.12mL daily.  Provide enough refills until AEX due in 01/2023 (~143mL w/ 2 refills) (Patient taking differently: Biestrogen cream 1.25 mg once daily (formulated estrogen cream) Sig: Apply 2 clicks or 0.53mL daily.  Provide enough refills until AEX due in 01/2023 (~170mL w/ 2 refills) Patient states she is taking twice a week.) 180 each 2    progesterone (PROMETRIUM) 100 MG capsule Take 1 capsule (100 mg total) by mouth daily. (Patient taking differently: Take 100 mg by mouth daily. Patient states she is taking twice a week.) 90 capsule 3   RESTASIS 0.05 % ophthalmic emulsion      vitamin B-12 (CYANOCOBALAMIN) 1000 MCG tablet Take 1 tablet (1,000 mcg total) by mouth daily.     No current facility-administered medications on file prior to visit.   Allergies  Allergen Reactions   Ciprofloxacin    Codeine Itching and Nausea Only   Macrobid [Nitrofurantoin] Nausea And Vomiting   Simethicone Nausea Only    Social History   Socioeconomic History   Marital status: Married    Spouse name: Dorene Sorrow   Number of children: 0   Years of education: Not on file   Highest education level: Not on file  Occupational History   Not on file  Tobacco Use   Smoking status: Never   Smokeless tobacco: Never  Vaping Use   Vaping status: Never Used  Substance and Sexual Activity   Alcohol use: No    Alcohol/week: 0.0 standard drinks of alcohol   Drug use: No   Sexual activity: Not Currently    Birth control/protection: Post-menopausal    Comment: 1st  intercourse 52 yo-1 partner  Other Topics Concern   Not on file  Social History Narrative   Not on file   Social Determinants of Health   Financial Resource Strain: Low Risk  (02/19/2021)   Overall Financial Resource Strain (CARDIA)    Difficulty of Paying Living Expenses: Not hard at all  Food Insecurity: No Food Insecurity (02/19/2021)   Hunger Vital Sign    Worried About Running Out of Food in the Last Year: Never true    Ran Out of Food in the Last Year: Never true  Transportation Needs: No Transportation Needs (02/19/2021)   PRAPARE - Administrator, Civil Service (Medical): No    Lack of Transportation (Non-Medical): No  Physical Activity: Sufficiently Active (02/19/2021)   Exercise Vital Sign    Days of Exercise per Week: 5 days    Minutes of Exercise per Session: 30  min  Stress: No Stress Concern Present (02/19/2021)   [employer] of Occupational Health - Occupational Stress Questionnaire    Feeling of Stress : Only a little  Social Connections: Socially Integrated (02/19/2021)   Social Connection and Isolation Panel [NHANES]    Frequency of Communication with Friends and Family: More than three times a week    Frequency of Social Gatherings with Friends and Family: More than three times a week    Attends Religious Services: More than 4 times per year    Active Member of [employer] or Organizations: Yes    Attends Banker Meetings: More than 4 times per year    Marital Status: Married  Catering manager Violence: Not At Risk (02/19/2021)   Humiliation, Afraid, Rape, and Kick questionnaire    Fear of Current or Ex-Partner: No    Emotionally Abused: No    Physically Abused: No    Sexually Abused: No      Review of Systems  All other systems reviewed and are negative.      Objective:   Physical Exam Vitals reviewed.  Constitutional:      Appearance: Normal appearance. She is normal weight.  Cardiovascular:     Rate and Rhythm: Normal rate and regular rhythm.     Pulses: Normal pulses.          Dorsalis pedis pulses are 2+ on the right side and 2+ on the left side.       Posterior tibial pulses are 2+ on the right side and 2+ on the left side.     Heart sounds: Normal heart sounds.  Musculoskeletal:     Right foot: Normal range of motion. No deformity, foot drop or prominent metatarsal heads.       Feet:  Feet:     Right foot:     Skin integrity: No ulcer, blister, skin breakdown, erythema or warmth.     Toenail Condition: Right toenails are normal.     Left foot:     Toenail Condition: Left toenails are normal.  Neurological:     General: No focal deficit present.     Mental Status: She is alert and oriented to person, place, and time. Mental status is at baseline.     Cranial Nerves: No cranial nerve deficit.      Sensory: No sensory deficit.     Motor: No weakness.     Coordination: Coordination normal.     Gait: Gait normal.     Deep Tendon Reflexes: Reflexes normal.           Assessment &  Plan:  Foot arch pain, right I believe the patient strained her longitudinal arch/plantar fascia while walking on sand.  I do not see any evidence of a fracture or stress fracture.  It is gradually improving over the last 2 to 3 weeks.  Recommended continued tincture of time and good supportive footwear.  The pain worsens I will be happy to order an x-ray.  Patient received her flu shot today.

## 2023-01-25 ENCOUNTER — Ambulatory Visit: Payer: Medicare Other

## 2023-02-17 ENCOUNTER — Ambulatory Visit (INDEPENDENT_AMBULATORY_CARE_PROVIDER_SITE_OTHER): Payer: Medicare Other | Admitting: Family Medicine

## 2023-02-17 ENCOUNTER — Encounter: Payer: Self-pay | Admitting: Family Medicine

## 2023-02-17 VITALS — BP 110/62 | HR 83 | Temp 98.1°F | Ht 65.0 in | Wt 154.8 lb

## 2023-02-17 DIAGNOSIS — R9431 Abnormal electrocardiogram [ECG] [EKG]: Secondary | ICD-10-CM

## 2023-02-17 DIAGNOSIS — I499 Cardiac arrhythmia, unspecified: Secondary | ICD-10-CM

## 2023-02-17 NOTE — Progress Notes (Signed)
Subjective:    Patient ID: Natalie Browning, female    DOB: 25-Mar-1946, 77 y.o.   MRN: [id number]  Cough  Patient reports a 1 week history of cough.  It is aggravating.  She denies any shortness of breath.  She did not has any chest pain.  She denies any pleurisy or hemoptysis.  Cough is productive of yellow mucus.  She reports a rattling sound in the center of her throat at night when she lies down.  However on her exam today she has an irregular heartbeat. Past Medical History:  Diagnosis Date   Acid reflux    Allergy    Anemia    Cataract    Cervical dysplasia 1970's   Gastritis    Osteopenia 09/2013   T score -2.0 FRAX 15%/2.8% overall stable from prior DEXA 2012   Past Surgical History:  Procedure Laterality Date   cataract surg     COLPOSCOPY     DILATION AND CURETTAGE OF UTERUS  03/18/2010   Endometrial polyp   EYE SURGERY     Laser   GYNECOLOGIC CRYOSURGERY     NASAL SINUS SURGERY     TONSILLECTOMY     UPPER GASTROINTESTINAL ENDOSCOPY  2006   Current Outpatient Medications on File Prior to Visit  Medication Sig Dispense Refill   Calcium Carbonate-Vitamin D (CALCIUM + D PO) Take by mouth. Reported on 07/15/2015     carboxymethylcellulose (REFRESH PLUS) 0.5 % SOLN 2-3 drop into both eyes     Cholecalciferol (VITAMIN D) 125 MCG (5000 UT) CAPS      MAGNESIUM PO Take by mouth. PRN     NONFORMULARY OR COMPOUNDED ITEM Biestrogen cream 1.25 mg once daily (formulated estrogen cream) Sig: Apply 2 clicks or 0.24mL daily.  Provide enough refills until AEX due in 01/2023 (~111mL w/ 2 refills) (Patient taking differently: Biestrogen cream 1.25 mg once daily (formulated estrogen cream) Sig: Apply 2 clicks or 0.40mL daily.  Provide enough refills until AEX due in 01/2023 (~165mL w/ 2 refills) Patient states she is taking twice a week.) 180 each 2   progesterone (PROMETRIUM) 100 MG capsule Take 1 capsule (100 mg total) by mouth daily. (Patient taking differently: Take 100 mg by mouth  daily. Patient states she is taking twice a week.) 90 capsule 3   RESTASIS 0.05 % ophthalmic emulsion      vitamin B-12 (CYANOCOBALAMIN) 1000 MCG tablet Take 1 tablet (1,000 mcg total) by mouth daily. (Patient not taking: Reported on 02/17/2023)     No current facility-administered medications on file prior to visit.   Allergies  Allergen Reactions   Ciprofloxacin    Codeine Itching and Nausea Only   Macrobid [Nitrofurantoin] Nausea And Vomiting   Simethicone Nausea Only    Social History   Socioeconomic History   Marital status: Married    Spouse name: Dorene Sorrow   Number of children: 0   Years of education: Not on file   Highest education level: Not on file  Occupational History   Not on file  Tobacco Use   Smoking status: Never   Smokeless tobacco: Never  Vaping Use   Vaping status: Never Used  Substance and Sexual Activity   Alcohol use: No    Alcohol/week: 0.0 standard drinks of alcohol   Drug use: No   Sexual activity: Not Currently    Birth control/protection: Post-menopausal    Comment: 1st intercourse 40 yo-1 partner  Other Topics Concern   Not on file  Social History  Narrative   Not on file   Social Determinants of Health   Financial Resource Strain: Low Risk  (02/19/2021)   Overall Financial Resource Strain (CARDIA)    Difficulty of Paying Living Expenses: Not hard at all  Food Insecurity: No Food Insecurity (02/19/2021)   Hunger Vital Sign    Worried About Running Out of Food in the Last Year: Never true    Ran Out of Food in the Last Year: Never true  Transportation Needs: No Transportation Needs (02/19/2021)   PRAPARE - Administrator, Civil Service (Medical): No    Lack of Transportation (Non-Medical): No  Physical Activity: Sufficiently Active (02/19/2021)   Exercise Vital Sign    Days of Exercise per Week: 5 days    Minutes of Exercise per Session: 30 min  Stress: No Stress Concern Present (02/19/2021)   [employer] of Occupational  Health - Occupational Stress Questionnaire    Feeling of Stress : Only a little  Social Connections: Socially Integrated (02/19/2021)   Social Connection and Isolation Panel [NHANES]    Frequency of Communication with Friends and Family: More than three times a week    Frequency of Social Gatherings with Friends and Family: More than three times a week    Attends Religious Services: More than 4 times per year    Active Member of [employer] or Organizations: Yes    Attends Banker Meetings: More than 4 times per year    Marital Status: Married  Catering manager Violence: Not At Risk (02/19/2021)   Humiliation, Afraid, Rape, and Kick questionnaire    Fear of Current or Ex-Partner: No    Emotionally Abused: No    Physically Abused: No    Sexually Abused: No      Review of Systems  Respiratory:  Positive for cough.   All other systems reviewed and are negative.      Objective:   Physical Exam Vitals reviewed.  Constitutional:      Appearance: Normal appearance. She is normal weight.  HENT:     Right Ear: Tympanic membrane and ear canal normal.     Left Ear: Tympanic membrane and ear canal normal.     Nose: No congestion or rhinorrhea.     Mouth/Throat:     Pharynx: No oropharyngeal exudate or posterior oropharyngeal erythema.  Cardiovascular:     Rate and Rhythm: Normal rate. Rhythm irregular.     Pulses: Normal pulses.          Dorsalis pedis pulses are 2+ on the right side and 2+ on the left side.       Posterior tibial pulses are 2+ on the right side and 2+ on the left side.     Heart sounds: Normal heart sounds.  Pulmonary:     Effort: Pulmonary effort is normal. No respiratory distress.     Breath sounds: Normal breath sounds. No stridor. No wheezing, rhonchi or rales.  Chest:     Chest wall: No tenderness.  Abdominal:     General: Abdomen is flat. Bowel sounds are normal.     Palpations: Abdomen is soft.  Musculoskeletal:     Right foot: Normal range of  motion. No deformity, foot drop or prominent metatarsal heads.       Feet:  Feet:     Right foot:     Skin integrity: No ulcer, blister, skin breakdown, erythema or warmth.     Toenail Condition: Right toenails are normal.  Left foot:     Toenail Condition: Left toenails are normal.  Neurological:     General: No focal deficit present.     Mental Status: She is alert and oriented to person, place, and time. Mental status is at baseline.     Cranial Nerves: No cranial nerve deficit.     Sensory: No sensory deficit.     Motor: No weakness.     Coordination: Coordination normal.     Gait: Gait normal.     Deep Tendon Reflexes: Reflexes normal.   EKG shows sinus arrhythmia with a short PR interval and nonspecific T wave inversions in the inferior and lateral leads.      Assessment & Plan:  Irregular cardiac rhythm - Plan: EKG 12-Lead Thankfully there is no evidence of atrial fibrillation.  The patient has a cough due to an upper respiratory infection.  I recommended that she try Claritin and Mucinex for the cough.  I am concerned by the abnormal EKG.  Therefore I recommended a coronary artery calcium score to evaluate for any potential for coronary artery disease

## 2023-02-22 ENCOUNTER — Ambulatory Visit (HOSPITAL_COMMUNITY)
Admission: RE | Admit: 2023-02-22 | Discharge: 2023-02-22 | Disposition: A | Discharge status: Home / Self Care | Payer: Self-pay | Source: Ambulatory Visit | Attending: Family Medicine | Admitting: Family Medicine

## 2023-02-22 DIAGNOSIS — R9431 Abnormal electrocardiogram [ECG] [EKG]: Secondary | ICD-10-CM | POA: Insufficient documentation

## 2023-02-22 DIAGNOSIS — I499 Cardiac arrhythmia, unspecified: Secondary | ICD-10-CM | POA: Insufficient documentation

## 2023-05-03 DIAGNOSIS — Z1283 Encounter for screening for malignant neoplasm of skin: Secondary | ICD-10-CM | POA: Diagnosis not present

## 2023-05-03 DIAGNOSIS — L570 Actinic keratosis: Secondary | ICD-10-CM | POA: Diagnosis not present

## 2023-05-03 DIAGNOSIS — D225 Melanocytic nevi of trunk: Secondary | ICD-10-CM | POA: Diagnosis not present

## 2023-05-03 DIAGNOSIS — X32XXXD Exposure to sunlight, subsequent encounter: Secondary | ICD-10-CM | POA: Diagnosis not present

## 2023-07-03 DIAGNOSIS — Z961 Presence of intraocular lens: Secondary | ICD-10-CM | POA: Diagnosis not present

## 2023-07-03 DIAGNOSIS — H04123 Dry eye syndrome of bilateral lacrimal glands: Secondary | ICD-10-CM | POA: Diagnosis not present

## 2023-07-03 DIAGNOSIS — H524 Presbyopia: Secondary | ICD-10-CM | POA: Diagnosis not present

## 2023-09-04 ENCOUNTER — Other Ambulatory Visit: Payer: Self-pay

## 2023-09-04 DIAGNOSIS — Z79890 Hormone replacement therapy (postmenopausal): Secondary | ICD-10-CM

## 2023-09-04 NOTE — Telephone Encounter (Signed)
 Med refill request: Bi-est C/ream Last AEX: 11/24/2020-B&P, 02/02/2022-med check Next AEX: nothing scheduled.  Last MMG (if hormonal med): 08/2021-[facility] Refill authorized: rx pend.   Msg sent to appt desk to schedule appt.

## 2023-09-05 NOTE — Telephone Encounter (Signed)
 Jennye Moccasin, CMA Left message for patient to call and schedule appointment.

## 2023-09-06 ENCOUNTER — Other Ambulatory Visit: Payer: Self-pay | Admitting: Family Medicine

## 2023-09-06 DIAGNOSIS — Z1231 Encounter for screening mammogram for malignant neoplasm of breast: Secondary | ICD-10-CM

## 2023-09-06 NOTE — Telephone Encounter (Signed)
 Pt LVM in triage line stating that she needs refill on cream. However, was advised that she needs to make an appt for a refill but she was confused because she said she was looking at a paper that said her last appt with us  was in 01/2023. Requesting a CB.

## 2023-09-07 NOTE — Telephone Encounter (Signed)
 Spoke w/ the pt and she reported that she was incorrect in her VM. She just thought her appt was in 01/2023 because the last prescription she picked up for the Bi-Est cream was 01/2023.   Pt reported that she has weaned down to using Bi-Est Cream and progesterone  once weekly and has about 30 doses left of progesterone  but no cream and isnt sure as to what she should do?   I offered to make an appt for her during call but wanted to inquire if it could be done after scheduled mammo in June (09/21/2023)?   Please advise.

## 2023-09-07 NOTE — Telephone Encounter (Signed)
 Will provide short-term refill if her appt cannot be within the next month.

## 2023-09-08 MED ORDER — NONFORMULARY OR COMPOUNDED ITEM: 0 refills | Status: DC

## 2023-09-08 NOTE — Telephone Encounter (Signed)
 Rx called intro [place] and pt notified of DXA results/recommendations.  Pt inquiring if ok to address bone health information/recommendations at visit for 7/7 for B&P?

## 2023-09-08 NOTE — Telephone Encounter (Signed)
 Rx pend

## 2023-09-08 NOTE — Addendum Note (Signed)
 Addended by: Tahliyah Anagnos D on: 09/08/2023 10:14 AM   Modules accepted: Orders

## 2023-09-08 NOTE — Telephone Encounter (Signed)
 Pt scheduled for B&P with EB on 10/23/2023.   Ok to send refill on Bi-est cream until appt?

## 2023-09-21 ENCOUNTER — Ambulatory Visit
Admission: RE | Admit: 2023-09-21 | Discharge: 2023-09-21 | Disposition: A | Discharge status: Home / Self Care | Source: Ambulatory Visit | Attending: Family Medicine | Admitting: Family Medicine

## 2023-09-21 DIAGNOSIS — Z1231 Encounter for screening mammogram for malignant neoplasm of breast: Secondary | ICD-10-CM | POA: Diagnosis not present

## 2023-10-23 ENCOUNTER — Ambulatory Visit (INDEPENDENT_AMBULATORY_CARE_PROVIDER_SITE_OTHER): Admitting: Obstetrics and Gynecology

## 2023-10-23 ENCOUNTER — Encounter: Payer: Self-pay | Admitting: Obstetrics and Gynecology

## 2023-10-23 VITALS — BP 118/72 | HR 64 | Ht 64.25 in | Wt 152.0 lb

## 2023-10-23 DIAGNOSIS — Z1331 Encounter for screening for depression: Secondary | ICD-10-CM | POA: Diagnosis not present

## 2023-10-23 DIAGNOSIS — Z01419 Encounter for gynecological examination (general) (routine) without abnormal findings: Secondary | ICD-10-CM | POA: Diagnosis not present

## 2023-10-23 DIAGNOSIS — Z9189 Other specified personal risk factors, not elsewhere classified: Secondary | ICD-10-CM

## 2023-10-23 DIAGNOSIS — Z79890 Hormone replacement therapy (postmenopausal): Secondary | ICD-10-CM

## 2023-10-23 MED ORDER — PROGESTERONE MICRONIZED 100 MG PO CAPS: 100.0000 mg | ORAL_CAPSULE | Freq: Every day | ORAL | 3 refills | Status: AC

## 2023-10-23 NOTE — Progress Notes (Signed)
 78 y.o. y.o. female here for annual exam. No LMP recorded. Patient is postmenopausal.    G0 presents for breast and pelvic exam. No GYN complaints. Postmenopausal - on HRT. She uses Triest estrogen cream (2 pumps daily instead of 4). She has tried to stop in the past and cannot tolerate. Cryosurgery years ago, subsequent paps normal. Osteopenia with elevated FRAX. She was on Fosamax years ago for a few months but did not tolerate. She declines treatment in the past.  No fractures. 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 Patient is postmenopausal.   Contraception: post menopausal status  Health Maintenance Last Pap: No longer screening per guidelines Last mammogram: 09/21/23. Results were: Normal Last colonoscopy: 2024. Results were: Normal, 10-year recall Last Dxa: 08/15/2017. Results were: T-score -1.9, FRAX 21% / 11% 11/25/22 dxa -1.6 frax 13.8, 3.4% positive frax score needs treatment. She is interested in [facility]. Counseled on r/b/a/I and brochure given. Patient okay with starting PA and she will consider this.  There is no height or weight on file to calculate BMI.     02/17/2023   11:44 AM 12/03/2021    8:32 AM 02/19/2021    8:27 AM  Depression screen PHQ 2/9  Decreased Interest 0 0 0  Down, Depressed, Hopeless 0 0 0  PHQ - 2 Score 0 0 0    There were no vitals taken for this visit.  No results found for: DIAGPAP, HPVHIGH, ADEQPAP  GYN HISTORY: No results found for: DIAGPAP, HPVHIGH, ADEQPAP  OB History  Gravida Para Term Preterm AB Living  0       SAB IAB Ectopic Multiple Live Births          Past Medical History:  Diagnosis Date   Acid reflux    Allergy    Anemia    Cataract    Cervical dysplasia 1970's   Gastritis    Osteopenia 09/2013   T score -2.0 FRAX 15%/2.8%  overall stable from prior DEXA 2012    Past Surgical History:  Procedure Laterality Date   cataract surg     COLPOSCOPY     DILATION AND CURETTAGE OF UTERUS  03/18/2010   Endometrial polyp   EYE SURGERY     Laser   GYNECOLOGIC CRYOSURGERY     NASAL SINUS SURGERY     TONSILLECTOMY     UPPER GASTROINTESTINAL ENDOSCOPY  2006    Current Outpatient Medications on File Prior to Visit  Medication Sig Dispense Refill   Calcium Carbonate-Vitamin D (CALCIUM + D PO) Take by mouth. Reported on 07/15/2015     carboxymethylcellulose (REFRESH PLUS) 0.5 % SOLN 2-3 drop into both eyes     Cholecalciferol (VITAMIN D) 125 MCG (5000 UT) CAPS      MAGNESIUM PO Take by mouth. PRN     NONFORMULARY OR COMPOUNDED ITEM Biestrogen cream 1.25 mg once daily (formulated estrogen cream) Sig: Apply 2 clicks or 0.5mL daily. Quantity: To last until appt scheduled for 10/23/2023. (Approx. ) Refills: Zero 21 each 0   progesterone  (PROMETRIUM ) 100 MG capsule Take 1 capsule (100 mg total) by mouth daily. (Patient taking differently: Take 100 mg by mouth daily. Patient states she is taking twice a week.) 90 capsule 3   RESTASIS 0.05 % ophthalmic emulsion      vitamin  B-12 (CYANOCOBALAMIN) 1000 MCG tablet Take 1 tablet (1,000 mcg total) by mouth daily. (Patient not taking: Reported on 02/17/2023)     No current facility-administered medications on file prior to visit.    Social History   Socioeconomic History   Marital status: Married    Spouse name: Christopher   Number of children: 0   Years of education: Not on file   Highest education level: Not on file  Occupational History   Not on file  Tobacco Use   Smoking status: Never   Smokeless tobacco: Never  Vaping Use   Vaping status: Never Used  Substance and Sexual Activity   Alcohol use: No    Alcohol/week: 0.0 standard drinks of alcohol   Drug use: No   Sexual activity: Not Currently    Birth control/protection: Post-menopausal    Comment: 1st  intercourse 25 yo-1 partner  Other Topics Concern   Not on file  Social History Narrative   Not on file   Social Drivers of Health   Financial Resource Strain: Low Risk  (02/19/2021)   Overall Financial Resource Strain (CARDIA)    Difficulty of Paying Living Expenses: Not hard at all  Food Insecurity: No Food Insecurity (02/19/2021)   Hunger Vital Sign    Worried About Running Out of Food in the Last Year: Never true    Ran Out of Food in the Last Year: Never true  Transportation Needs: No Transportation Needs (02/19/2021)   PRAPARE - Administrator, Civil Service (Medical): No    Lack of Transportation (Non-Medical): No  Physical Activity: Sufficiently Active (02/19/2021)   Exercise Vital Sign    Days of Exercise per Week: 5 days    Minutes of Exercise per Session: 30 min  Stress: No Stress Concern Present (02/19/2021)   [employer] of Occupational Health - Occupational Stress Questionnaire    Feeling of Stress : Only a little  Social Connections: Socially Integrated (02/19/2021)   Social Connection and Isolation Panel    Frequency of Communication with Friends and Family: More than three times a week    Frequency of Social Gatherings with Friends and Family: More than three times a week    Attends Religious Services: More than 4 times per year    Active Member of [employer] or Organizations: Yes    Attends Engineer, structural: More than 4 times per year    Marital Status: Married  Catering manager Violence: Not At Risk (02/19/2021)   Humiliation, Afraid, Rape, and Kick questionnaire    Fear of Current or Ex-Partner: No    Emotionally Abused: No    Physically Abused: No    Sexually Abused: No    Family History  Problem Relation Age of Onset   Diabetes Mother    Heart disease Mother    Hypertension Mother    Heart disease Father    Hypertension Father    Cancer Sister        Non Hodgkin's     Allergies  Allergen Reactions   Ciprofloxacin     Codeine Itching and Nausea Only   Macrobid  [Nitrofurantoin ] Nausea And Vomiting   Simethicone Nausea Only      Patient's last menstrual period was No LMP recorded. Patient is postmenopausal..             Review of Systems Alls systems reviewed and are negative.     Physical Exam Constitutional:      Appearance: Normal appearance.  Genitourinary:  Vulva and urethral meatus normal.     No lesions in the vagina.     Right Labia: No rash, lesions or skin changes.    Left Labia: No lesions, skin changes or rash.    No vaginal discharge or tenderness.     No vaginal prolapse present.    Moderate vaginal atrophy present.     Right Adnexa: not tender, not palpable and no mass present.    Left Adnexa: not tender, not palpable and no mass present.    No cervical motion tenderness or discharge.     Uterus is not enlarged, tender or irregular.  Breasts:    Right: Normal.     Left: Normal.  HENT:     Head: Normocephalic.  Neck:     Thyroid : No thyroid  mass, thyromegaly or thyroid  tenderness.  Cardiovascular:     Rate and Rhythm: Normal rate and regular rhythm.     Heart sounds: Normal heart sounds, S1 normal and S2 normal.  Pulmonary:     Effort: Pulmonary effort is normal.     Breath sounds: Normal breath sounds and air entry.  Abdominal:     General: There is no distension.     Palpations: Abdomen is soft. There is no mass.     Tenderness: There is no abdominal tenderness. There is no guarding or rebound.  Musculoskeletal:        General: Normal range of motion.     Cervical back: Full passive range of motion without pain, normal range of motion and neck supple. No tenderness.     Right lower leg: No edema.     Left lower leg: No edema.  Neurological:     Mental Status: She is alert.  Skin:    General: Skin is warm.  Psychiatric:        Mood and Affect: Mood normal.        Behavior: Behavior normal.        Thought Content: Thought content normal.  Vitals and  nursing note reviewed. Exam conducted with a chaperone present.       A:         Well Woman GYN exam, h/o remote cryo with normal pap smears after, osteopenia positive frax                             P:        Pap smear not indicated Encouraged annual mammogram screening Colon cancer screening up-to-date DXA up-to-date Patient would like to consider evenity for treatment for osteopenia with positive frax score. Labs and immunizations to do with PMD Discussed breast self exams Encouraged healthy lifestyle practices Encouraged Vit D and Calcium   No follow-ups on file.  Almarie MARLA Carpen

## 2023-10-24 NOTE — Addendum Note (Signed)
 Addended by: GLENNON ALMARIE POUR on: 10/24/2023 08:49 AM   Modules accepted: Level of Service

## 2023-11-01 ENCOUNTER — Telehealth: Payer: Self-pay

## 2023-11-01 NOTE — Telephone Encounter (Signed)
 Copied from CRM [id number]. Topic: General - Call Back - No Documentation >> Oct 31, 2023  3:45 PM Ameerah G wrote: Reason for CRM: patient recently went to the gyno and was told that she get a evenity shot. Please call patient back to discuss this further with her. Please leave a vm if patient does not answer.  [phone])

## 2023-11-03 ENCOUNTER — Encounter: Payer: Self-pay | Admitting: Advanced Practice Midwife

## 2023-11-20 ENCOUNTER — Other Ambulatory Visit: Payer: Self-pay | Admitting: *Deleted

## 2023-11-20 DIAGNOSIS — M810 Age-related osteoporosis without current pathological fracture: Secondary | ICD-10-CM

## 2023-11-20 MED ORDER — ROMOSOZUMAB-AQQG 105 MG/1.17ML ~~LOC~~ SOSY: 210.0000 mg | PREFILLED_SYRINGE | Freq: Once | SUBCUTANEOUS | Status: AC

## 2023-11-20 NOTE — Progress Notes (Signed)
.  ev

## 2024-01-15 ENCOUNTER — Other Ambulatory Visit: Payer: Self-pay

## 2024-01-15 DIAGNOSIS — Z79890 Hormone replacement therapy (postmenopausal): Secondary | ICD-10-CM

## 2024-01-15 MED ORDER — NONFORMULARY OR COMPOUNDED ITEM: 0 refills | Status: AC

## 2024-01-15 NOTE — Telephone Encounter (Signed)
 Med refill request:  Biestrogen cream 1.25 mg once daily (formulated estrogen cream) Sig: Apply 2 clicks or 0.5mL daily. Quantity: To last until appt scheduled for 10/23/2023. (Approx. ) Refills: Zero   Last B & P:  10/23/23 Next AEX:  Not yet scheduled Last MMG (if hormonal med):  09/21/23 Refill authorized? Please Advise.

## 2024-01-30 ENCOUNTER — Ambulatory Visit (INDEPENDENT_AMBULATORY_CARE_PROVIDER_SITE_OTHER)

## 2024-01-30 DIAGNOSIS — Z23 Encounter for immunization: Secondary | ICD-10-CM | POA: Diagnosis not present

## 2024-01-30 NOTE — Progress Notes (Signed)
 Patient is in office today for a nurse visit for Immunization. Patient Injection was given in the  Right deltoid. Patient tolerated injection well.

## 2024-02-07 ENCOUNTER — Encounter: Payer: Self-pay | Admitting: *Deleted

## 2024-03-17 IMAGING — MG MM DIGITAL DIAGNOSTIC UNILAT*R* W/ TOMO W/ CAD
6 series · 6 of 18 positions shown · non-contrast
Comparison: Previous exam(s).

CLINICAL DATA: Screening recall for a right breast asymmetry.

EXAM:
DIGITAL DIAGNOSTIC UNILATERAL RIGHT MAMMOGRAM WITH TOMOSYNTHESIS AND
CAD
TECHNIQUE: Right digital diagnostic mammography and breast tomosynthesis was
performed. The images were evaluated with computer-aided detection.

[R MLO synth-2D (1 of 2)]
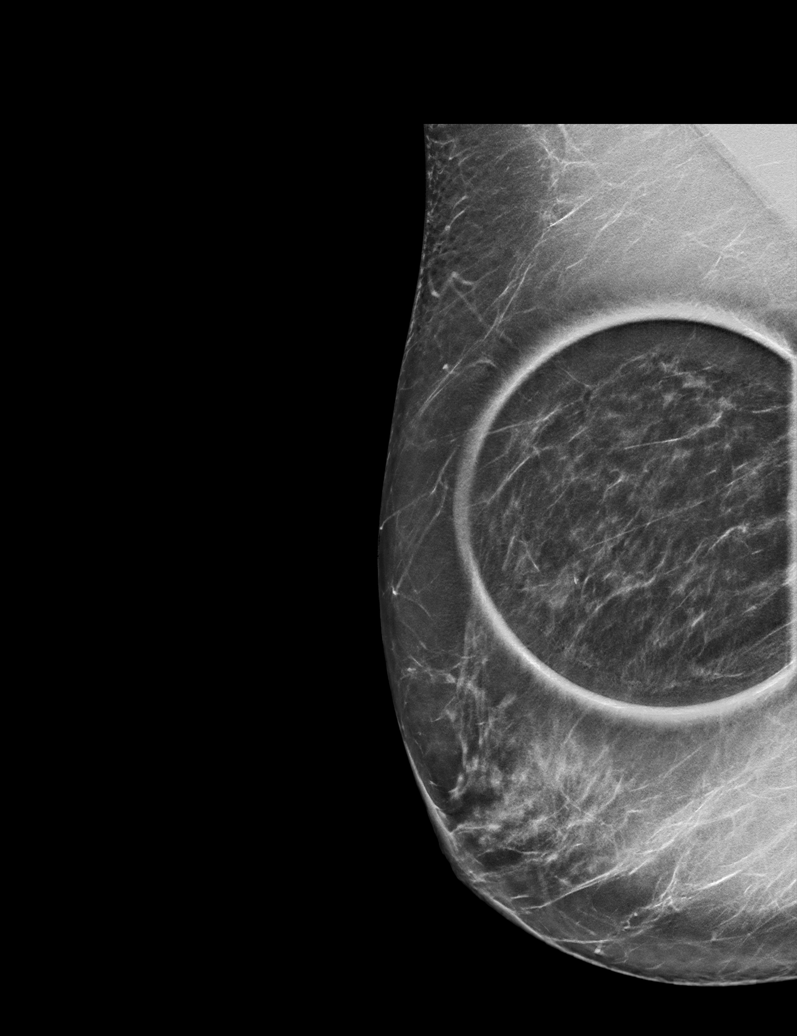

[R MLO synth-2D (2 of 2)]
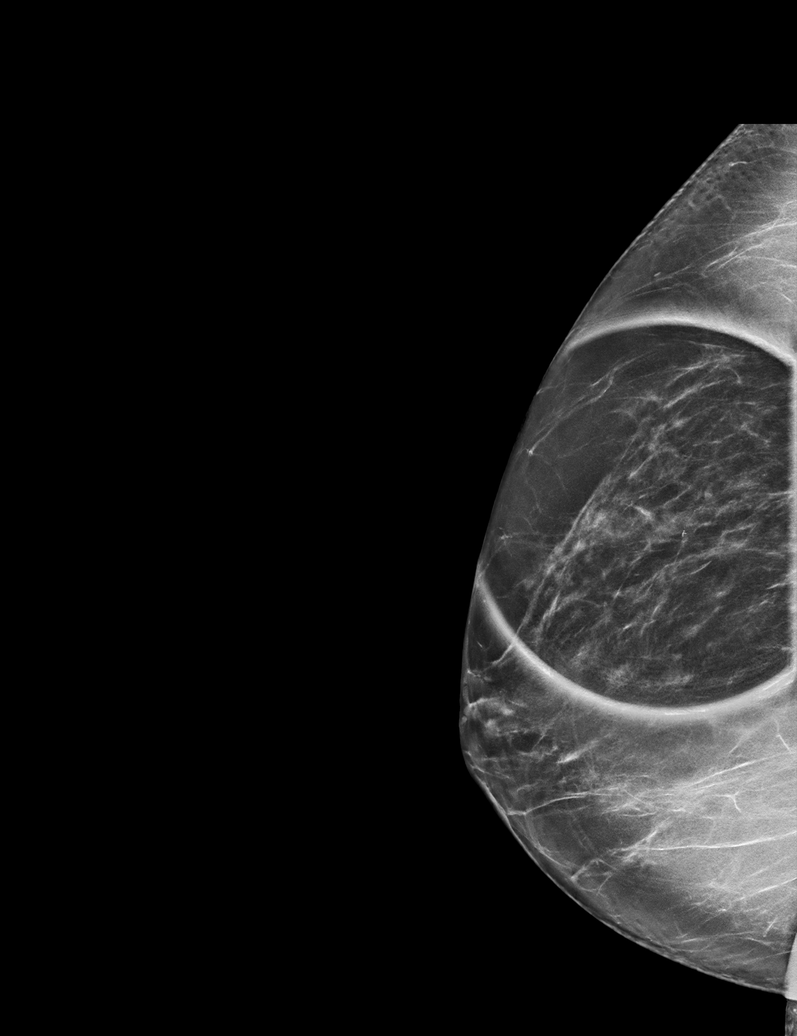

[R ML synth-2D]
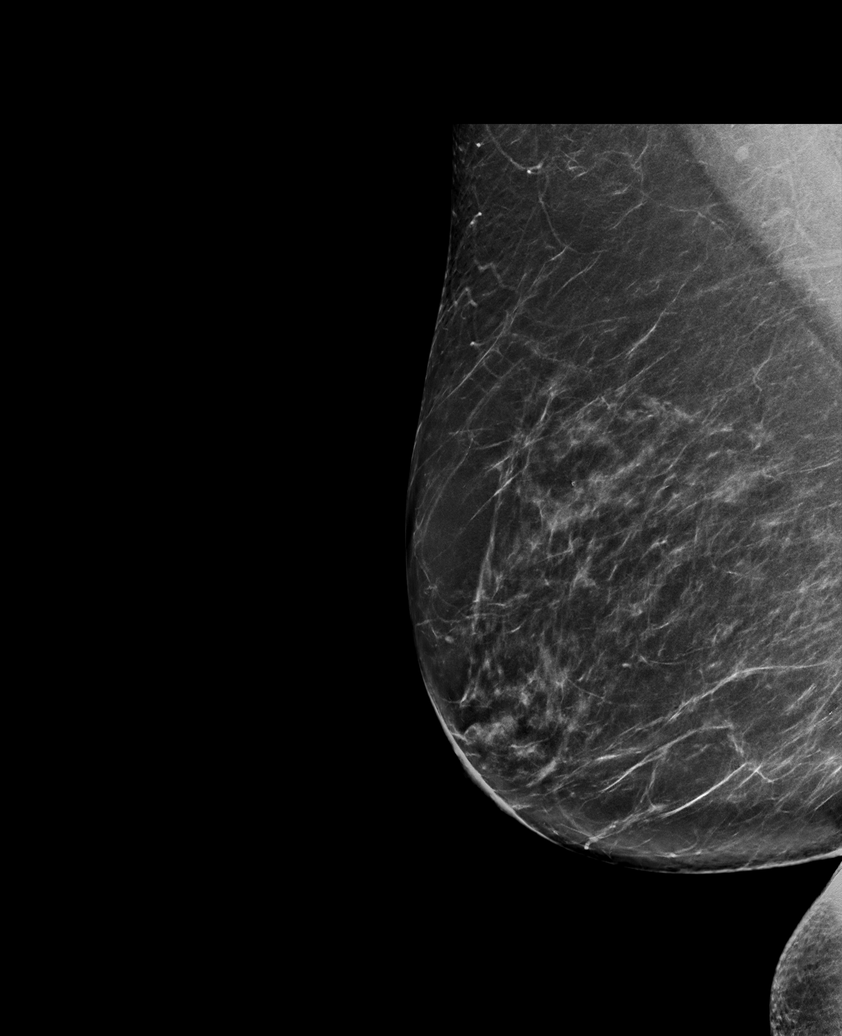

[R ML tomo · tomo slice 41/80.0]
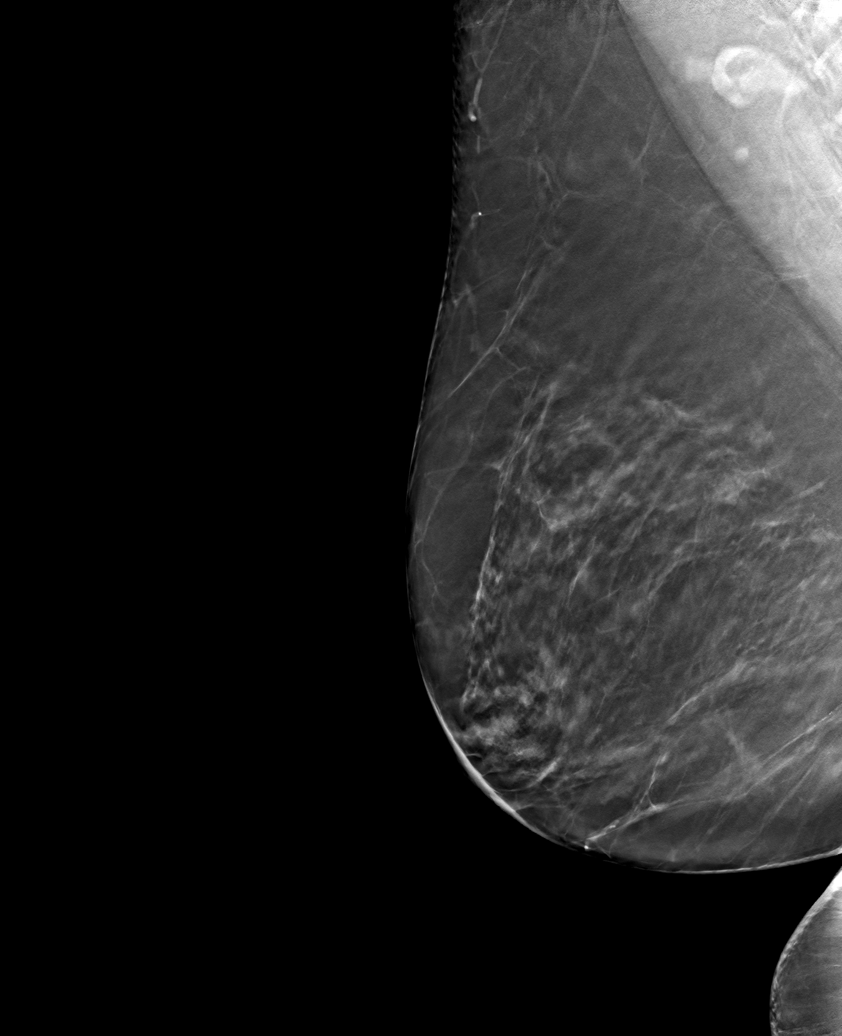

[R MLO tomo (1 of 2) · tomo slice 31/62.0]
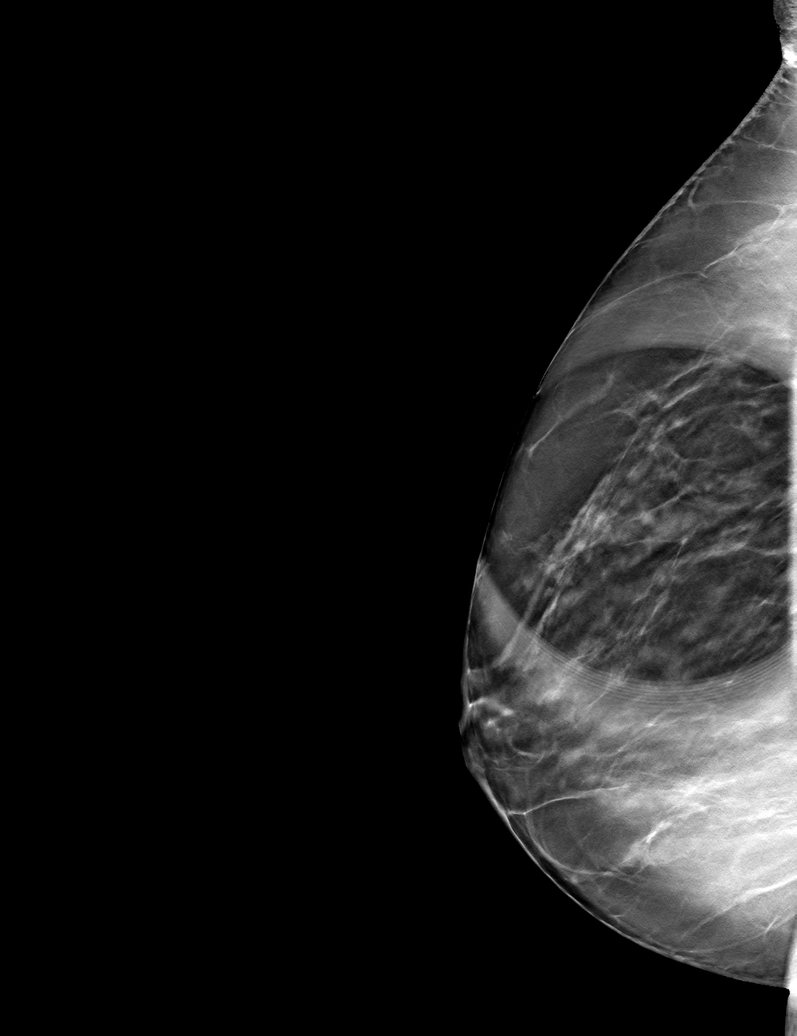

[R MLO tomo (2 of 2) · tomo slice 35/69.0]
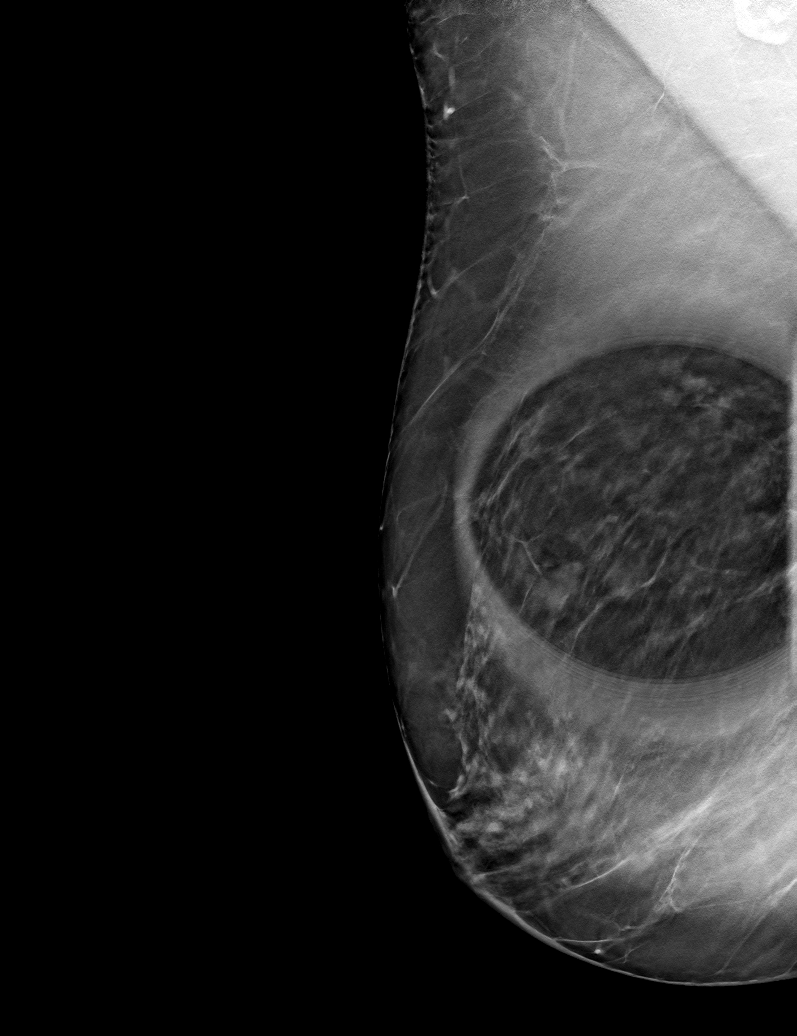

[6 of 18 positions shown; findings below may reference images not displayed]

ACR Breast Density Category b: There are scattered areas of
fibroglandular density.
FINDINGS: Spot compression tomosynthesis images over the asymmetry of concern
in the superior anterior right breast demonstrates that the
underlying tissue appears stable compared to the patient's multiple
prior mammograms. No suspicious mammographic features such as
distortion or so seated calcifications are identified.
IMPRESSION: Resolution of the right breast asymmetry consistent with overlapping
fibroglandular tissue.

RECOMMENDATION:
Screening mammogram in one year.(Code:4K-5-XSX)

I have discussed the findings and recommendations with the patient.
If applicable, a reminder letter will be sent to the patient
regarding the next appointment.

BI-RADS CATEGORY  1: Negative.
